# Patient Record
Sex: Male | Born: 1960 | Hispanic: Yes | Smoking: Never smoker
Health system: Southern US, Community
[De-identification: ages and names within clinical notes are randomized; demographics above are authoritative.]

## PROBLEM LIST (undated history)

## (undated) DIAGNOSIS — I10 Essential (primary) hypertension: Secondary | ICD-10-CM

## (undated) DIAGNOSIS — Z9889 Other specified postprocedural states: Secondary | ICD-10-CM

## (undated) DIAGNOSIS — R972 Elevated prostate specific antigen [PSA]: Secondary | ICD-10-CM

## (undated) DIAGNOSIS — G4730 Sleep apnea, unspecified: Secondary | ICD-10-CM

## (undated) DIAGNOSIS — T7840XA Allergy, unspecified, initial encounter: Secondary | ICD-10-CM

## (undated) DIAGNOSIS — E119 Type 2 diabetes mellitus without complications: Secondary | ICD-10-CM

## (undated) DIAGNOSIS — C4490 Unspecified malignant neoplasm of skin, unspecified: Secondary | ICD-10-CM

## (undated) DIAGNOSIS — R112 Nausea with vomiting, unspecified: Secondary | ICD-10-CM

## (undated) DIAGNOSIS — G4733 Obstructive sleep apnea (adult) (pediatric): Secondary | ICD-10-CM

## (undated) HISTORY — DX: Essential (primary) hypertension: I10

## (undated) HISTORY — DX: Nausea with vomiting, unspecified: Z98.890

## (undated) HISTORY — DX: Unspecified malignant neoplasm of skin, unspecified: C44.90

## (undated) HISTORY — DX: Allergy, unspecified, initial encounter: T78.40XA

## (undated) HISTORY — DX: Nausea with vomiting, unspecified: R11.2

## (undated) HISTORY — PX: KNEE ARTHROSCOPY: SUR90

## (undated) HISTORY — DX: Obstructive sleep apnea (adult) (pediatric): G47.33

## (undated) HISTORY — DX: Type 2 diabetes mellitus without complications: E11.9

## (undated) HISTORY — DX: Sleep apnea, unspecified: G47.30

## (undated) HISTORY — DX: Elevated prostate specific antigen (PSA): R97.20

---

## 2001-07-21 ENCOUNTER — Encounter: Payer: Self-pay | Admitting: Family Medicine

## 2003-02-02 ENCOUNTER — Encounter: Payer: Self-pay | Admitting: Pulmonary Disease

## 2003-02-16 ENCOUNTER — Encounter: Payer: Self-pay | Admitting: Pulmonary Disease

## 2007-11-09 ENCOUNTER — Ambulatory Visit: Payer: Self-pay | Admitting: Family Medicine

## 2007-11-09 DIAGNOSIS — Z9189 Other specified personal risk factors, not elsewhere classified: Secondary | ICD-10-CM | POA: Insufficient documentation

## 2007-11-09 DIAGNOSIS — J309 Allergic rhinitis, unspecified: Secondary | ICD-10-CM | POA: Insufficient documentation

## 2007-11-09 LAB — CONVERTED CEMR LAB
Blood in Urine, dipstick: NEGATIVE
Glucose, Urine, Semiquant: NEGATIVE
Ketones, urine, test strip: NEGATIVE
Specific Gravity, Urine: 1.02
WBC Urine, dipstick: NEGATIVE
pH: 6.5

## 2007-11-17 LAB — CONVERTED CEMR LAB
Basophils Absolute: 0 10*3/uL (ref 0.0–0.1)
Basophils Relative: 0.6 % (ref 0.0–3.0)
Calcium: 9.4 mg/dL (ref 8.4–10.5)
Chloride: 106 meq/L (ref 96–112)
Creatinine, Ser: 0.8 mg/dL (ref 0.4–1.5)
Direct LDL: 153.8 mg/dL
Eosinophils Absolute: 0.2 10*3/uL (ref 0.0–0.7)
GFR calc Af Amer: 134 mL/min
GFR calc non Af Amer: 111 mL/min
HDL: 36.3 mg/dL — ABNORMAL LOW (ref >39.0)
MCHC: 35.2 g/dL (ref 30.0–36.0)
MCV: 93.9 fL (ref 78.0–100.0)
Monocytes Absolute: 0.6 10*3/uL (ref 0.1–1.0)
Neutro Abs: 4.1 10*3/uL (ref 1.4–7.7)
Neutrophils Relative %: 55.8 % (ref 43.0–77.0)
RBC: 4.21 M/uL — ABNORMAL LOW (ref 4.22–5.81)
TSH: 1.49 microintl units/mL (ref 0.35–5.50)
Total Bilirubin: 0.9 mg/dL (ref 0.3–1.2)

## 2007-11-24 ENCOUNTER — Ambulatory Visit: Payer: Self-pay | Admitting: Pulmonary Disease

## 2007-11-24 DIAGNOSIS — G4733 Obstructive sleep apnea (adult) (pediatric): Secondary | ICD-10-CM

## 2010-03-17 ENCOUNTER — Encounter: Payer: Self-pay | Admitting: Family Medicine

## 2010-03-17 ENCOUNTER — Ambulatory Visit (INDEPENDENT_AMBULATORY_CARE_PROVIDER_SITE_OTHER): Payer: 59 | Admitting: Family Medicine

## 2010-03-17 ENCOUNTER — Ambulatory Visit (INDEPENDENT_AMBULATORY_CARE_PROVIDER_SITE_OTHER)
Admission: RE | Admit: 2010-03-17 | Discharge: 2010-03-17 | Disposition: A | Discharge status: Home / Self Care | Payer: 59 | Source: Ambulatory Visit | Attending: Family Medicine | Admitting: Family Medicine

## 2010-03-17 ENCOUNTER — Telehealth: Payer: Self-pay | Admitting: Family Medicine

## 2010-03-17 VITALS — BP 140/90 | HR 64 | Temp 98.2°F

## 2010-03-17 DIAGNOSIS — S93409A Sprain of unspecified ligament of unspecified ankle, initial encounter: Secondary | ICD-10-CM

## 2010-03-17 NOTE — Progress Notes (Signed)
  Subjective:    Patient ID: Jose Faulkner, male    DOB: 04-Mar-1960, 50 y.o.   MRN: [id number]  HPI Here for an injury to the left ankle which occurred at home yesterday evening when he stepped into a hole in his yard. He felt and heard a "pop" on the outside of the foot. He has done nothing for it since then.    Review of Systems  Constitutional: Negative.   Musculoskeletal: Positive for joint swelling and arthralgias.       Objective:   Physical Exam  Constitutional:       Walks with a limp, not in a lot of pain   Musculoskeletal:       The lateral malleolus of the left ankle is tender and swollen and warm. No crepitus, full ROM           Assessment & Plan:  This is at least a sprain, need to rule out an avulsion fracture. Stay off the foot today, use ice packs and Motrin prn

## 2010-03-17 NOTE — Telephone Encounter (Signed)
Pt would like xray results. 

## 2010-03-19 ENCOUNTER — Ambulatory Visit: Payer: Self-pay | Admitting: Family Medicine

## 2010-03-19 NOTE — Telephone Encounter (Signed)
See report. No fractures, so this was just a sprain. Wear the brace for a week or two. Follow up prn

## 2010-03-19 NOTE — Telephone Encounter (Signed)
Mess left and advised to call back if questions

## 2010-10-29 ENCOUNTER — Other Ambulatory Visit (INDEPENDENT_AMBULATORY_CARE_PROVIDER_SITE_OTHER): Payer: 59

## 2010-10-29 DIAGNOSIS — Z0000 Encounter for general adult medical examination without abnormal findings: Secondary | ICD-10-CM

## 2010-10-29 LAB — HEPATIC FUNCTION PANEL
ALT: 33 U/L (ref 0–53)
AST: 24 U/L (ref 0–37)
Bilirubin, Direct: 0 mg/dL (ref 0.0–0.3)
Total Protein: 7.7 g/dL (ref 6.0–8.3)

## 2010-10-29 LAB — CBC WITH DIFFERENTIAL/PLATELET
Basophils Relative: 0.7 % (ref 0.0–3.0)
Eosinophils Relative: 3.4 % (ref 0.0–5.0)
HCT: 41.5 % (ref 39.0–52.0)
Lymphs Abs: 1.8 10*3/uL (ref 0.7–4.0)
MCV: 95.3 fl (ref 78.0–100.0)
Monocytes Absolute: 0.5 10*3/uL (ref 0.1–1.0)
Monocytes Relative: 7.1 % (ref 3.0–12.0)
Neutrophils Relative %: 60.9 % (ref 43.0–77.0)
RBC: 4.36 Mil/uL (ref 4.22–5.81)
WBC: 6.5 10*3/uL (ref 4.5–10.5)

## 2010-10-29 LAB — POCT URINALYSIS DIPSTICK
Bilirubin, UA: NEGATIVE
Blood, UA: NEGATIVE
Ketones, UA: NEGATIVE
Leukocytes, UA: NEGATIVE
Spec Grav, UA: 1.015
pH, UA: 7.5

## 2010-10-29 LAB — BASIC METABOLIC PANEL
Chloride: 107 mEq/L (ref 96–112)
Potassium: 4.2 mEq/L (ref 3.5–5.1)
Sodium: 140 mEq/L (ref 135–145)

## 2010-10-29 LAB — LIPID PANEL
Cholesterol: 186 mg/dL (ref 0–200)
HDL: 41.5 mg/dL (ref >39.00)
Triglycerides: 112 mg/dL (ref 0.0–149.0)
VLDL: 22.4 mg/dL (ref 0.0–40.0)

## 2010-11-03 ENCOUNTER — Telehealth: Payer: Self-pay | Admitting: Family Medicine

## 2010-11-03 NOTE — Telephone Encounter (Signed)
Message copied by Baldemar Friday on Mon Nov 03, 2010  4:37 PM ------      Message from: Gershon Crane A      Created: Fri Oct 31, 2010  1:26 PM       normal

## 2010-11-03 NOTE — Telephone Encounter (Signed)
Left voice message with normal results. 

## 2010-11-05 ENCOUNTER — Encounter: Payer: Self-pay | Admitting: Family Medicine

## 2010-11-05 ENCOUNTER — Ambulatory Visit (INDEPENDENT_AMBULATORY_CARE_PROVIDER_SITE_OTHER): Payer: 59 | Admitting: Family Medicine

## 2010-11-05 VITALS — BP 130/80 | HR 74 | Temp 98.4°F | Ht 69.25 in | Wt 211.0 lb

## 2010-11-05 DIAGNOSIS — Z0000 Encounter for general adult medical examination without abnormal findings: Secondary | ICD-10-CM

## 2010-11-05 DIAGNOSIS — L989 Disorder of the skin and subcutaneous tissue, unspecified: Secondary | ICD-10-CM

## 2010-11-05 NOTE — Progress Notes (Signed)
  Subjective:    Patient ID: Jose Faulkner, male    DOB: 1960-10-05, 50 y.o.   MRN: [id number]  HPI 50 yr old male for a cpx. He feels well and has no concerns.    Review of Systems  Constitutional: Negative.   HENT: Negative.   Eyes: Negative.   Respiratory: Negative.   Cardiovascular: Negative.   Gastrointestinal: Negative.   Genitourinary: Negative.   Musculoskeletal: Negative.   Skin: Negative.   Neurological: Negative.   Hematological: Negative.   Psychiatric/Behavioral: Negative.        Objective:   Physical Exam  Constitutional: He is oriented to person, place, and time. He appears well-developed and well-nourished. No distress.  HENT:  Head: Normocephalic and atraumatic.  Right Ear: External ear normal.  Left Ear: External ear normal.  Nose: Nose normal.  Mouth/Throat: Oropharynx is clear and moist. No oropharyngeal exudate.  Eyes: Conjunctivae and EOM are normal. Pupils are equal, round, and reactive to light. Right eye exhibits no discharge. Left eye exhibits no discharge. No scleral icterus.  Neck: Neck supple. No JVD present. No tracheal deviation present. No thyromegaly present.  Cardiovascular: Normal rate, regular rhythm, normal heart sounds and intact distal pulses.  Exam reveals no gallop and no friction rub.   No murmur heard. Pulmonary/Chest: Effort normal and breath sounds normal. No respiratory distress. He has no wheezes. He has no rales. He exhibits no tenderness.  Abdominal: Soft. Bowel sounds are normal. He exhibits no distension and no mass. There is no tenderness. There is no rebound and no guarding.  Genitourinary: Rectum normal, prostate normal and penis normal. Guaiac negative stool. No penile tenderness.  Musculoskeletal: Normal range of motion. He exhibits no edema and no tenderness.  Lymphadenopathy:    He has no cervical adenopathy.  Neurological: He is alert and oriented to person, place, and time. He has normal reflexes. No cranial nerve  deficit. He exhibits normal muscle tone. Coordination normal.  Skin: Skin is warm and dry. No rash noted. He is not diaphoretic. No pallor.       There is a raised lesion on the scalp vertex with areas of erythema and some pearly white borders  Psychiatric: He has a normal mood and affect. His behavior is normal. Judgment and thought content normal.          Assessment & Plan:  Well exam. Refer to dermatology for the scalp lesion. This is worrisome for a possible basal cell cancer.

## 2010-12-06 HISTORY — PX: BASAL CELL CARCINOMA EXCISION: SHX1214

## 2011-02-24 ENCOUNTER — Ambulatory Visit (INDEPENDENT_AMBULATORY_CARE_PROVIDER_SITE_OTHER): Payer: 59 | Admitting: Pulmonary Disease

## 2011-02-24 ENCOUNTER — Encounter: Payer: Self-pay | Admitting: Pulmonary Disease

## 2011-02-24 VITALS — BP 136/80 | HR 78 | Temp 98.3°F | Ht 68.0 in | Wt 205.6 lb

## 2011-02-24 DIAGNOSIS — G4733 Obstructive sleep apnea (adult) (pediatric): Secondary | ICD-10-CM

## 2011-02-24 NOTE — Progress Notes (Signed)
  Subjective:    Patient ID: Jose Faulkner, male    DOB: December 08, 1960, 51 y.o.   MRN: [id number]  HPI Patient comes in today for followup of his known obstructive sleep apnea.  He has been wearing CPAP compliantly, but has not been seen in almost 4 years.  Patient states he has done very well with the CPAP, but most recently has had issues with his machine and also mask/headgear.  He is overdue for new equipment.  He feels that he had been sleeping well with excellent daytime alertness prior to this.  His weight has actually gone down since his last visit in 2009.  Sleep Questionnaire: What time do you typically go to bed?( Between what hours) 10:30 pm How long does it take you to fall asleep? 10 mins How many times during the night do you wake up? 2 What time do you get out of bed to start your day? 0530 Do you drive or operate heavy machinery in your occupation? No How much has your weight changed (up or down) over the past two years? (In pounds) 0 oz (0 kg) Have you ever had a sleep study before? Yes If yes, location of study? [facility], [place] If yes, date of study? 2005 Do you currently use CPAP? Yes If so, what pressure? ? Do you wear oxygen at any time? No     Review of Systems  Constitutional: Negative for fever and unexpected weight change.  HENT: Negative for ear pain, nosebleeds, congestion, sore throat, rhinorrhea, sneezing, trouble swallowing, dental problem, postnasal drip and sinus pressure.   Eyes: Negative for redness and itching.  Respiratory: Negative for cough, chest tightness, shortness of breath and wheezing.   Cardiovascular: Negative for palpitations and leg swelling.  Gastrointestinal: Negative for nausea and vomiting.  Genitourinary: Negative for dysuria.  Musculoskeletal: Negative for joint swelling.  Skin: Negative for rash.  Neurological: Negative for headaches.  Hematological: Does not bruise/bleed easily.  Psychiatric/Behavioral: Negative for dysphoric mood. The  patient is not nervous/anxious.        Objective:   Physical Exam Overweight male in no acute distress No skin breakdown or pressure necrosis from the CPAP mask Chest clear to auscultation Lower extremities without edema, no cyanosis Alert, does not appear to be sleepy, moves all 4 extremities.       Assessment & Plan:

## 2011-02-24 NOTE — Assessment & Plan Note (Signed)
The patient has a history of moderate obstructive sleep apnea, and has done well through the years on CPAP.  He is now in need of new equipment and headgear/mask, and will arrange this through his DME.  I have asked him to work aggressively on weight loss, and followup with me in one year if doing well.

## 2011-02-24 NOTE — Patient Instructions (Signed)
Will get you a new cpap machine with mask and headgear. Work on weight loss followup with me in one year.

## 2011-10-20 ENCOUNTER — Other Ambulatory Visit (INDEPENDENT_AMBULATORY_CARE_PROVIDER_SITE_OTHER): Payer: 59

## 2011-10-20 DIAGNOSIS — Z0000 Encounter for general adult medical examination without abnormal findings: Secondary | ICD-10-CM

## 2011-10-20 LAB — CBC WITH DIFFERENTIAL/PLATELET
Basophils Absolute: 0.1 K/uL (ref 0.0–0.1)
Basophils Relative: 0.9 % (ref 0.0–3.0)
Eosinophils Absolute: 0.2 K/uL (ref 0.0–0.7)
Eosinophils Relative: 3.4 % (ref 0.0–5.0)
HCT: 41.5 % (ref 39.0–52.0)
Hemoglobin: 13.7 g/dL (ref 13.0–17.0)
Lymphocytes Relative: 32.3 % (ref 12.0–46.0)
Lymphs Abs: 2 K/uL (ref 0.7–4.0)
MCHC: 32.9 g/dL (ref 30.0–36.0)
MCV: 95.4 fl (ref 78.0–100.0)
Monocytes Absolute: 0.5 K/uL (ref 0.1–1.0)
Monocytes Relative: 7.5 % (ref 3.0–12.0)
Neutro Abs: 3.4 K/uL (ref 1.4–7.7)
Neutrophils Relative %: 55.9 % (ref 43.0–77.0)
Platelets: 178 K/uL (ref 150.0–400.0)
RBC: 4.35 Mil/uL (ref 4.22–5.81)
RDW: 13.1 % (ref 11.5–14.6)
WBC: 6 K/uL (ref 4.5–10.5)

## 2011-10-20 LAB — HEPATIC FUNCTION PANEL
ALT: 22 U/L (ref 0–53)
Total Bilirubin: 0.7 mg/dL (ref 0.3–1.2)
Total Protein: 7.5 g/dL (ref 6.0–8.3)

## 2011-10-20 LAB — POCT URINALYSIS DIPSTICK
Leukocytes, UA: NEGATIVE
Nitrite, UA: NEGATIVE
Protein, UA: NEGATIVE
pH, UA: 7

## 2011-10-20 LAB — LIPID PANEL
LDL Cholesterol: 128 mg/dL — ABNORMAL HIGH (ref 0–99)
Total CHOL/HDL Ratio: 4
VLDL: 13 mg/dL (ref 0.0–40.0)

## 2011-10-20 LAB — BASIC METABOLIC PANEL WITH GFR
BUN: 16 mg/dL (ref 6–23)
CO2: 28 meq/L (ref 19–32)
Calcium: 9 mg/dL (ref 8.4–10.5)
Chloride: 109 meq/L (ref 96–112)
Creatinine, Ser: 0.8 mg/dL (ref 0.4–1.5)
GFR: 114.04 mL/min
Glucose, Bld: 102 mg/dL — ABNORMAL HIGH (ref 70–99)
Potassium: 5 meq/L (ref 3.5–5.1)
Sodium: 142 meq/L (ref 135–145)

## 2011-10-20 LAB — TSH: TSH: 1.18 u[IU]/mL (ref 0.35–5.50)

## 2011-10-23 NOTE — Progress Notes (Signed)
Quick Note:  I left voice message with results. ______ 

## 2011-11-18 ENCOUNTER — Ambulatory Visit (INDEPENDENT_AMBULATORY_CARE_PROVIDER_SITE_OTHER): Payer: 59 | Admitting: Family Medicine

## 2011-11-18 ENCOUNTER — Encounter: Payer: Self-pay | Admitting: Family Medicine

## 2011-11-18 VITALS — BP 138/90 | HR 60 | Temp 97.9°F | Ht 68.25 in | Wt 190.0 lb

## 2011-11-18 DIAGNOSIS — Z0000 Encounter for general adult medical examination without abnormal findings: Secondary | ICD-10-CM

## 2011-11-18 NOTE — Progress Notes (Signed)
  Subjective:    Patient ID: Jose Faulkner, male    DOB: [birth year], 51 y.o.   MRN: [id number]  HPI 51 yr old male for a cpx. He feels well and has no concerns. He has lost 15 lbs in the past year by watching his diet.    Review of Systems  Constitutional: Negative.   HENT: Negative.   Eyes: Negative.   Respiratory: Negative.   Cardiovascular: Negative.   Gastrointestinal: Negative.   Genitourinary: Negative.   Musculoskeletal: Negative.   Skin: Negative.   Neurological: Negative.   Hematological: Negative.   Psychiatric/Behavioral: Negative.        Objective:   Physical Exam  Constitutional: He is oriented to person, place, and time. He appears well-developed and well-nourished. No distress.  HENT:  Head: Normocephalic and atraumatic.  Right Ear: External ear normal.  Left Ear: External ear normal.  Nose: Nose normal.  Mouth/Throat: Oropharynx is clear and moist. No oropharyngeal exudate.  Eyes: Conjunctivae normal and EOM are normal. Pupils are equal, round, and reactive to light. Right eye exhibits no discharge. Left eye exhibits no discharge. No scleral icterus.  Neck: Neck supple. No JVD present. No tracheal deviation present. No thyromegaly present.  Cardiovascular: Normal rate, regular rhythm, normal heart sounds and intact distal pulses.  Exam reveals no gallop and no friction rub.   No murmur heard. Pulmonary/Chest: Effort normal and breath sounds normal. No respiratory distress. He has no wheezes. He has no rales. He exhibits no tenderness.  Abdominal: Soft. Bowel sounds are normal. He exhibits no distension and no mass. There is no tenderness. There is no rebound and no guarding.  Genitourinary: Rectum normal, prostate normal and penis normal. Guaiac negative stool. No penile tenderness.  Musculoskeletal: Normal range of motion. He exhibits no edema and no tenderness.  Lymphadenopathy:    He has no cervical adenopathy.  Neurological: He is alert and oriented to  person, place, and time. He has normal reflexes. No cranial nerve deficit. He exhibits normal muscle tone. Coordination normal.  Skin: Skin is warm and dry. No rash noted. He is not diaphoretic. No erythema. No pallor.  Psychiatric: He has a normal mood and affect. His behavior is normal. Judgment and thought content normal.          Assessment & Plan:  Well exam.

## 2012-02-24 ENCOUNTER — Ambulatory Visit: Payer: 59 | Admitting: Pulmonary Disease

## 2012-11-14 ENCOUNTER — Ambulatory Visit (INDEPENDENT_AMBULATORY_CARE_PROVIDER_SITE_OTHER): Payer: 59 | Admitting: Family Medicine

## 2012-11-14 ENCOUNTER — Encounter: Payer: Self-pay | Admitting: Family Medicine

## 2012-11-14 VITALS — BP 138/82 | HR 55 | Temp 97.6°F | Wt 194.0 lb

## 2012-11-14 DIAGNOSIS — IMO0002 Reserved for concepts with insufficient information to code with codable children: Secondary | ICD-10-CM

## 2012-11-14 DIAGNOSIS — S2341XA Sprain of ribs, initial encounter: Secondary | ICD-10-CM

## 2012-11-14 DIAGNOSIS — L089 Local infection of the skin and subcutaneous tissue, unspecified: Secondary | ICD-10-CM

## 2012-11-14 DIAGNOSIS — S86912A Strain of unspecified muscle(s) and tendon(s) at lower leg level, left leg, initial encounter: Secondary | ICD-10-CM
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
## 2012-11-14 NOTE — Progress Notes (Signed)
Pre visit review using our clinic review tool, if applicable. No additional management support is needed unless otherwise documented below in the visit note. 

## 2012-12-07 ENCOUNTER — Other Ambulatory Visit (INDEPENDENT_AMBULATORY_CARE_PROVIDER_SITE_OTHER): Payer: 59

## 2012-12-07 DIAGNOSIS — Z0000 Encounter for general adult medical examination without abnormal findings: Secondary | ICD-10-CM

## 2012-12-07 LAB — CBC WITH DIFFERENTIAL/PLATELET
Basophils Relative: 0.9 % (ref 0.0–3.0)
Eosinophils Relative: 3.5 % (ref 0.0–5.0)
Hemoglobin: 14.4 g/dL (ref 13.0–17.0)
Lymphocytes Relative: 27.5 % (ref 12.0–46.0)
MCHC: 33.8 g/dL (ref 30.0–36.0)
Monocytes Relative: 7.8 % (ref 3.0–12.0)
Neutro Abs: 3.7 10*3/uL (ref 1.4–7.7)
Neutrophils Relative %: 60.3 % (ref 43.0–77.0)
RBC: 4.6 Mil/uL (ref 4.22–5.81)
WBC: 6.1 10*3/uL (ref 4.5–10.5)

## 2012-12-07 LAB — HEPATIC FUNCTION PANEL
ALT: 23 U/L (ref 0–53)
AST: 18 U/L (ref 0–37)
Albumin: 4.1 g/dL (ref 3.5–5.2)
Alkaline Phosphatase: 71 U/L (ref 39–117)
Bilirubin, Direct: 0.1 mg/dL (ref 0.0–0.3)
Total Protein: 7.7 g/dL (ref 6.0–8.3)

## 2012-12-07 LAB — LIPID PANEL
HDL: 45.8 mg/dL (ref >39.00)
Total CHOL/HDL Ratio: 5
VLDL: 16.8 mg/dL (ref 0.0–40.0)

## 2012-12-07 LAB — POCT URINALYSIS DIPSTICK
Bilirubin, UA: NEGATIVE
Ketones, UA: NEGATIVE
Leukocytes, UA: NEGATIVE
Spec Grav, UA: >=1.03
pH, UA: 6

## 2012-12-07 LAB — BASIC METABOLIC PANEL
CO2: 28 mEq/L (ref 19–32)
Calcium: 9.1 mg/dL (ref 8.4–10.5)
GFR: 105.56 mL/min (ref >60.00)
Sodium: 140 mEq/L (ref 135–145)

## 2012-12-14 ENCOUNTER — Ambulatory Visit (INDEPENDENT_AMBULATORY_CARE_PROVIDER_SITE_OTHER): Payer: 59 | Admitting: Family Medicine

## 2012-12-14 ENCOUNTER — Encounter: Payer: Self-pay | Admitting: Family Medicine

## 2012-12-14 VITALS — BP 140/80 | HR 57 | Temp 97.9°F | Ht 68.0 in | Wt 191.0 lb

## 2012-12-14 DIAGNOSIS — R972 Elevated prostate specific antigen [PSA]: Secondary | ICD-10-CM

## 2012-12-14 DIAGNOSIS — Z0000 Encounter for general adult medical examination without abnormal findings: Secondary | ICD-10-CM

## 2012-12-14 NOTE — Progress Notes (Signed)
Pre visit review using our clinic review tool, if applicable. No additional management support is needed unless otherwise documented below in the visit note. 

## 2012-12-14 NOTE — Progress Notes (Signed)
   Subjective:    Patient ID: Jose Faulkner, male    DOB: 1960/02/17, 52 y.o.   MRN: [id number]  HPI 52 yr old male for a cpx. He feels well with no complaints. When I ask him about any urinary sx he admits to some frequency, urgency, slow stream, and nocturia. No discomfort. Of note his baseline PSA last week was 5.13.    Review of Systems  Constitutional: Negative.   HENT: Negative.   Eyes: Negative.   Respiratory: Negative.   Cardiovascular: Negative.   Gastrointestinal: Negative.   Genitourinary: Positive for urgency and frequency. Negative for dysuria, hematuria, flank pain, discharge, penile swelling, scrotal swelling, enuresis, difficulty urinating, genital sores, penile pain and testicular pain.  Musculoskeletal: Negative.   Skin: Negative.   Neurological: Negative.   Psychiatric/Behavioral: Negative.        Objective:   Physical Exam  Constitutional: He is oriented to person, place, and time. He appears well-developed and well-nourished. No distress.  HENT:  Head: Normocephalic and atraumatic.  Right Ear: External ear normal.  Left Ear: External ear normal.  Nose: Nose normal.  Mouth/Throat: Oropharynx is clear and moist. No oropharyngeal exudate.  Eyes: Conjunctivae and EOM are normal. Pupils are equal, round, and reactive to light. Right eye exhibits no discharge. Left eye exhibits no discharge. No scleral icterus.  Neck: Neck supple. No JVD present. No tracheal deviation present. No thyromegaly present.  Cardiovascular: Normal rate, regular rhythm, normal heart sounds and intact distal pulses.  Exam reveals no gallop and no friction rub.   No murmur heard. EKG normal   Pulmonary/Chest: Effort normal and breath sounds normal. No respiratory distress. He has no wheezes. He has no rales. He exhibits no tenderness.  Abdominal: Soft. Bowel sounds are normal. He exhibits no distension and no mass. There is no tenderness. There is no rebound and no guarding.  Genitourinary:  Rectum normal and penis normal. Guaiac negative stool. No penile tenderness.  Prostate is fairly enlarged but smooth and non-tender   Musculoskeletal: Normal range of motion. He exhibits no edema and no tenderness.  Lymphadenopathy:    He has no cervical adenopathy.  Neurological: He is alert and oriented to person, place, and time. He has normal reflexes. No cranial nerve deficit. He exhibits normal muscle tone. Coordination normal.  Skin: Skin is warm and dry. No rash noted. He is not diaphoretic. No erythema. No pallor.  Psychiatric: He has a normal mood and affect. His behavior is normal. Judgment and thought content normal.          Assessment & Plan:  Well exam. He has typical BPH sx and his PSA is elevated. We will refer him to Urology to evaluate. Set up his first colonoscopy. He needs to lose weight to get his cholesterol and glucose down.

## 2012-12-15 ENCOUNTER — Encounter: Payer: Self-pay | Admitting: Internal Medicine

## 2012-12-26 ENCOUNTER — Telehealth: Payer: Self-pay | Admitting: Family Medicine

## 2012-12-26 NOTE — Telephone Encounter (Signed)
This is noted the pt's health maintenance.

## 2012-12-26 NOTE — Telephone Encounter (Signed)
Pt decline flu shot

## 2013-01-16 ENCOUNTER — Ambulatory Visit (AMBULATORY_SURGERY_CENTER): Payer: Self-pay

## 2013-01-16 VITALS — Ht 68.0 in | Wt 185.0 lb

## 2013-01-16 DIAGNOSIS — Z1211 Encounter for screening for malignant neoplasm of colon: Secondary | ICD-10-CM

## 2013-01-16 MED ORDER — MOVIPREP 100 G PO SOLR: 1.0000 | Freq: Once | ORAL | Status: DC

## 2013-01-18 ENCOUNTER — Encounter: Payer: Self-pay | Admitting: Internal Medicine

## 2013-01-20 ENCOUNTER — Telehealth: Payer: Self-pay | Admitting: Internal Medicine

## 2013-01-20 NOTE — Telephone Encounter (Signed)
i attempted to reach this pt four times t/o day.  No answer and no ID on machine

## 2013-01-25 ENCOUNTER — Encounter: Payer: 59 | Admitting: Internal Medicine

## 2013-08-21 ENCOUNTER — Encounter: Payer: Self-pay | Admitting: Physician Assistant

## 2013-08-21 ENCOUNTER — Ambulatory Visit (INDEPENDENT_AMBULATORY_CARE_PROVIDER_SITE_OTHER): Payer: 59 | Admitting: Physician Assistant

## 2013-08-21 VITALS — BP 138/80 | HR 62 | Temp 98.6°F | Resp 18 | Wt 198.0 lb

## 2013-08-21 DIAGNOSIS — H8110 Benign paroxysmal vertigo, unspecified ear: Secondary | ICD-10-CM

## 2013-08-21 NOTE — Progress Notes (Signed)
Pre visit review using our clinic review tool, if applicable. No additional management support is needed unless otherwise documented below in the visit note. 

## 2013-08-21 NOTE — Patient Instructions (Addendum)
Force NON dairy fluids, drinking plenty of water is best.    Over the Counter Flonase OR Nasacort AQ 1 spray in each nostril twice a day as needed. Use the "crossover" technique into opposite nostril spraying toward opposite ear @ 45 degree angle, not straight up into nostril.   Plain Over the Counter Allegra (NOT D )  160 daily , OR Loratidine 10 mg , OR Zyrtec 10 mg @ bedtime  as needed.  Try using the Home Maneuver provided to help your symptoms.  If emergency symptoms discussed during visit developed, seek medical attention immediately.  Followup as needed, or for worsening or persistent symptoms despite treatment.    Benign Positional Vertigo Vertigo means you feel like you or your surroundings are moving when they are not. Benign positional vertigo is the most common form of vertigo. Benign means that the cause of your condition is not serious. Benign positional vertigo is more common in older adults. CAUSES  Benign positional vertigo is the result of an upset in the labyrinth system. This is an area in the middle ear that helps control your balance. This may be caused by a viral infection, head injury, or repetitive motion. However, often no specific cause is found. SYMPTOMS  Symptoms of benign positional vertigo occur when you move your head or eyes in different directions. Some of the symptoms may include:  Loss of balance and falls.  Vomiting.  Blurred vision.  Dizziness.  Nausea.  Involuntary eye movements (nystagmus). DIAGNOSIS  Benign positional vertigo is usually diagnosed by physical exam. If the specific cause of your benign positional vertigo is unknown, your caregiver may perform imaging tests, such as magnetic resonance imaging (MRI) or computed tomography (CT). TREATMENT  Your caregiver may recommend movements or procedures to correct the benign positional vertigo. Medicines such as meclizine, benzodiazepines, and medicines for nausea may be used to treat your  symptoms. In rare cases, if your symptoms are caused by certain conditions that affect the inner ear, you may need surgery. HOME CARE INSTRUCTIONS   Follow your caregiver's instructions.  Move slowly. Do not make sudden body or head movements.  Avoid driving.  Avoid operating heavy machinery.  Avoid performing any tasks that would be dangerous to you or others during a vertigo episode.  Drink enough fluids to keep your urine clear or pale yellow. SEEK IMMEDIATE MEDICAL CARE IF:   You develop problems with walking, weakness, numbness, or using your arms, hands, or legs.  You have difficulty speaking.  You develop severe headaches.  Your nausea or vomiting continues or gets worse.  You develop visual changes.  Your family or friends notice any behavioral changes.  Your condition gets worse.  You have a fever.  You develop a stiff neck or sensitivity to light. MAKE SURE YOU:   Understand these instructions.  Will watch your condition.  Will get help right away if you are not doing well or get worse. Document Released: 09/29/2005 Document Revised: 03/16/2011 Document Reviewed: 09/11/2010 [facility] Patient Information 2015 [place], [place]. This information is not intended to replace advice given to you by your health care provider. Make sure you discuss any questions you have with your health care provider.

## 2013-08-21 NOTE — Progress Notes (Signed)
Subjective:    Patient ID: Jose Faulkner, male    DOB: 1960/08/22, 53 y.o.   MRN: [id number]  Dizziness This is a new problem. The current episode started yesterday. The problem occurs constantly. The problem has been waxing and waning. Associated symptoms include headaches (mild), nausea and vertigo. Pertinent negatives include no abdominal pain, anorexia, arthralgias, change in bowel habit, chest pain, chills, congestion, coughing, diaphoresis, fatigue, fever, joint swelling, myalgias, neck pain, numbness, rash, sore throat, swollen glands, urinary symptoms, visual change, vomiting or weakness. Exacerbated by: moving head to the left or right quickly. He has tried rest, sleep and walking for the symptoms. The treatment provided mild relief.      Review of Systems  Constitutional: Negative for fever, chills, diaphoresis and fatigue.  HENT: Negative for congestion and sore throat.   Respiratory: Negative for cough and shortness of breath.   Cardiovascular: Negative for chest pain.  Gastrointestinal: Positive for nausea. Negative for vomiting, abdominal pain, diarrhea, anorexia and change in bowel habit.  Musculoskeletal: Negative for arthralgias, joint swelling, myalgias and neck pain.  Skin: Negative for rash.  Neurological: Positive for dizziness, vertigo and headaches (mild). Negative for syncope, weakness, light-headedness and numbness.  All other systems reviewed and are negative.    Past Medical History  Diagnosis Date  . Allergy   . Sleep apnea, obstructive     sees Dr. Gwenette Greet, uses CPAP  . Skin cancer     History   Social History  . Marital Status: Married    Spouse Name: Maribel    Number of Children: N/A  . Years of Education: N/A   Occupational History  . Civil engineer, contracting Unemployed   Social History Main Topics  . Smoking status: Never Smoker   . Smokeless tobacco: Never Used  . Alcohol Use: No  . Drug Use: No  . Sexual Activity: Not on file   Other Topics  Concern  . Not on file   Social History Narrative  . No narrative on file    Past Surgical History  Procedure Laterality Date  . Knee arthroscopy      left knee  . Basal cell carcinoma excision  Dec. 2012    per Dr. Sherrye Payor, scalp vertex     Family History  Problem Relation Age of Onset  . Colon cancer Neg Hx     No Known Allergies  Current Outpatient Prescriptions on File Prior to Visit  Medication Sig Dispense Refill  . cetirizine (ZYRTEC) 10 MG tablet Take 10 mg by mouth daily.        Marland Kitchen MOVIPREP 100 G SOLR Take 1 kit (200 g total) by mouth once.  1 kit  0  . Multiple Vitamin (MULTIVITAMIN) tablet Take 1 tablet by mouth daily.       No current facility-administered medications on file prior to visit.    EXAM: BP 138/80  Pulse 62  Temp(Src) 98.6 F (37 C) (Oral)  Resp 18  Wt 198 lb (89.812 kg)  SpO2 98%     Objective:   Physical Exam  Nursing note and vitals reviewed. Constitutional: He is oriented to person, place, and time. He appears well-developed and well-nourished. No distress.  HENT:  Head: Normocephalic and atraumatic.  Right Ear: External ear normal.  Left Ear: External ear normal.  Bilateral TMs slightly bulging, otherwise normal.   Eyes: Conjunctivae and EOM are normal. Pupils are equal, round, and reactive to light.  Neck: Normal range of motion. Neck supple.  Cardiovascular: Normal  rate, regular rhythm and intact distal pulses.   Pulmonary/Chest: Effort normal and breath sounds normal. No stridor. No respiratory distress. He has no wheezes. He has no rales. He exhibits no tenderness.  Lymphadenopathy:    He has no cervical adenopathy.  Neurological: He is alert and oriented to person, place, and time.  Negative Dix-Hallpike  Skin: Skin is warm and dry. He is not diaphoretic.  Psychiatric: He has a normal mood and affect. His behavior is normal. Judgment and thought content normal.     Lab Results  Component Value Date   WBC 6.1 12/07/2012    HGB 14.4 12/07/2012   HCT 42.7 12/07/2012   PLT 192.0 12/07/2012   GLUCOSE 103* 12/07/2012   CHOL 213* 12/07/2012   TRIG 84.0 12/07/2012   HDL 45.80 12/07/2012   LDLDIRECT 160.1 12/07/2012   LDLCALC 128* 10/20/2011   ALT 23 12/07/2012   AST 18 12/07/2012   NA 140 12/07/2012   K 4.2 12/07/2012   CL 106 12/07/2012   CREATININE 0.8 12/07/2012   BUN 15 12/07/2012   CO2 28 12/07/2012   TSH 0.97 12/07/2012   PSA 5.13* 12/07/2012        Assessment & Plan:  Axle was seen today for dizziness.  Diagnoses and associated orders for this visit:  BPPV (benign paroxysmal positional vertigo), unspecified laterality Comments: possibly ETD related. Trial antihistamine, nasal steroid spray. Eply home maneuver.    Return precautions provided, and patient handout on Vertigo and [place].  Plan to follow up as needed, or for worsening or persistent symptoms despite treatment.  Patient Instructions  Force NON dairy fluids, drinking plenty of water is best.    Over the Counter Flonase OR Nasacort AQ 1 spray in each nostril twice a day as needed. Use the "crossover" technique into opposite nostril spraying toward opposite ear @ 45 degree angle, not straight up into nostril.   Plain Over the Counter Allegra (NOT D )  160 daily , OR Loratidine 10 mg , OR Zyrtec 10 mg @ bedtime  as needed.  Try using the Home Maneuver provided to help your symptoms.  If emergency symptoms discussed during visit developed, seek medical attention immediately.  Followup as needed, or for worsening or persistent symptoms despite treatment.

## 2013-12-20 ENCOUNTER — Telehealth: Payer: Self-pay | Admitting: Family Medicine

## 2013-12-20 NOTE — Telephone Encounter (Signed)
Pt came in he need a rx fax over to [facility] for the following supplies.   rx cpap nasal and mask supplies    Fax to ;Lucrezia Starch at [phone]

## 2013-12-20 NOTE — Telephone Encounter (Signed)
Dr. Sarajane Jews does not write orders for this. Pt will have to contact his pulmonary doctor.

## 2013-12-21 NOTE — Telephone Encounter (Signed)
lmovm to call back.

## 2013-12-22 NOTE — Telephone Encounter (Signed)
S/w pt and told him what dr fry said

## 2014-01-15 ENCOUNTER — Other Ambulatory Visit (INDEPENDENT_AMBULATORY_CARE_PROVIDER_SITE_OTHER): Payer: 59

## 2014-01-15 DIAGNOSIS — Z0000 Encounter for general adult medical examination without abnormal findings: Secondary | ICD-10-CM

## 2014-01-15 LAB — BASIC METABOLIC PANEL
BUN: 21 mg/dL (ref 6–23)
CALCIUM: 9.2 mg/dL (ref 8.4–10.5)
CO2: 28 mEq/L (ref 19–32)
Chloride: 107 mEq/L (ref 96–112)
Creatinine, Ser: 0.9 mg/dL (ref 0.4–1.5)
GFR: 96.87 mL/min (ref >60.00)
GLUCOSE: 113 mg/dL — AB (ref 70–99)
Potassium: 4.5 mEq/L (ref 3.5–5.1)
SODIUM: 141 meq/L (ref 135–145)

## 2014-01-15 LAB — POCT URINALYSIS DIPSTICK
BILIRUBIN UA: NEGATIVE
Blood, UA: NEGATIVE
Glucose, UA: NEGATIVE
Leukocytes, UA: NEGATIVE
NITRITE UA: NEGATIVE
Spec Grav, UA: >=1.03
Urobilinogen, UA: 0.2
pH, UA: 5.5

## 2014-01-15 LAB — CBC WITH DIFFERENTIAL/PLATELET
Basophils Absolute: 0.1 10*3/uL (ref 0.0–0.1)
Basophils Relative: 0.8 % (ref 0.0–3.0)
Eosinophils Absolute: 0.3 10*3/uL (ref 0.0–0.7)
Eosinophils Relative: 3.8 % (ref 0.0–5.0)
HCT: 44.4 % (ref 39.0–52.0)
Hemoglobin: 14.6 g/dL (ref 13.0–17.0)
Lymphocytes Relative: 33.1 % (ref 12.0–46.0)
Lymphs Abs: 2.4 10*3/uL (ref 0.7–4.0)
MCHC: 33 g/dL (ref 30.0–36.0)
MCV: 95 fl (ref 78.0–100.0)
MONO ABS: 0.6 10*3/uL (ref 0.1–1.0)
MONOS PCT: 8.4 % (ref 3.0–12.0)
NEUTROS PCT: 53.9 % (ref 43.0–77.0)
Neutro Abs: 3.9 10*3/uL (ref 1.4–7.7)
PLATELETS: 195 10*3/uL (ref 150.0–400.0)
RBC: 4.67 Mil/uL (ref 4.22–5.81)
RDW: 13.1 % (ref 11.5–15.5)
WBC: 7.3 10*3/uL (ref 4.0–10.5)

## 2014-01-15 LAB — HEPATIC FUNCTION PANEL
ALK PHOS: 79 U/L (ref 39–117)
ALT: 32 U/L (ref 0–53)
AST: 21 U/L (ref 0–37)
Albumin: 4.2 g/dL (ref 3.5–5.2)
BILIRUBIN DIRECT: 0.1 mg/dL (ref 0.0–0.3)
BILIRUBIN TOTAL: 0.7 mg/dL (ref 0.2–1.2)
Total Protein: 7.8 g/dL (ref 6.0–8.3)

## 2014-01-15 LAB — PSA: PSA: 8.75 ng/mL — ABNORMAL HIGH (ref 0.10–4.00)

## 2014-01-15 LAB — LIPID PANEL
Cholesterol: 219 mg/dL — ABNORMAL HIGH (ref 0–200)
HDL: 40.5 mg/dL (ref >39.00)
LDL CALC: 152 mg/dL — AB (ref 0–99)
NONHDL: 178.5
Total CHOL/HDL Ratio: 5
Triglycerides: 133 mg/dL (ref 0.0–149.0)
VLDL: 26.6 mg/dL (ref 0.0–40.0)

## 2014-01-15 LAB — TSH: TSH: 1.91 u[IU]/mL (ref 0.35–4.50)

## 2014-01-22 ENCOUNTER — Ambulatory Visit (INDEPENDENT_AMBULATORY_CARE_PROVIDER_SITE_OTHER): Payer: 59 | Admitting: Family Medicine

## 2014-01-22 ENCOUNTER — Encounter: Payer: Self-pay | Admitting: Family Medicine

## 2014-01-22 VITALS — BP 157/78 | HR 50 | Temp 97.6°F | Ht 68.0 in | Wt 200.0 lb

## 2014-01-22 DIAGNOSIS — Z0000 Encounter for general adult medical examination without abnormal findings: Secondary | ICD-10-CM

## 2014-01-22 NOTE — Progress Notes (Signed)
   Subjective:    Patient ID: Jose Faulkner, male    DOB: 02-Feb-1960, 54 y.o.   MRN: [id number]  HPI 54 yr old male for a cpx. He feels well.    Review of Systems  Constitutional: Negative.   HENT: Negative.   Eyes: Negative.   Respiratory: Negative.   Cardiovascular: Negative.   Gastrointestinal: Negative.   Genitourinary: Negative.   Musculoskeletal: Negative.   Skin: Negative.   Neurological: Negative.   Psychiatric/Behavioral: Negative.        Objective:   Physical Exam  Constitutional: He is oriented to person, place, and time. He appears well-developed and well-nourished. No distress.  HENT:  Head: Normocephalic and atraumatic.  Right Ear: External ear normal.  Left Ear: External ear normal.  Nose: Nose normal.  Mouth/Throat: Oropharynx is clear and moist. No oropharyngeal exudate.  Eyes: Conjunctivae and EOM are normal. Pupils are equal, round, and reactive to light. Right eye exhibits no discharge. Left eye exhibits no discharge. No scleral icterus.  Neck: Neck supple. No JVD present. No tracheal deviation present. No thyromegaly present.  Cardiovascular: Normal rate, regular rhythm, normal heart sounds and intact distal pulses.  Exam reveals no gallop and no friction rub.   No murmur heard. EKG normal   Pulmonary/Chest: Effort normal and breath sounds normal. No respiratory distress. He has no wheezes. He has no rales. He exhibits no tenderness.  Abdominal: Soft. Bowel sounds are normal. He exhibits no distension and no mass. There is no tenderness. There is no rebound and no guarding.  Musculoskeletal: Normal range of motion. He exhibits no edema or tenderness.  Lymphadenopathy:    He has no cervical adenopathy.  Neurological: He is alert and oriented to person, place, and time. He has normal reflexes. No cranial nerve deficit. He exhibits normal muscle tone. Coordination normal.  Skin: Skin is warm and dry. No rash noted. He is not diaphoretic. No erythema. No  pallor.  Psychiatric: He has a normal mood and affect. His behavior is normal. Judgment and thought content normal.          Assessment & Plan:  Well exam. We spent time discussing the importance of him exercising regularly and watching a healthy diet. His weight, glucose, chol, and BP have all gone up. Recheck a BP here in 6 months. He is set to see Urology next month to follow up on his prostate. He missed the colonoscopy last year so he will contact the GI office again to have this set up.

## 2014-01-22 NOTE — Progress Notes (Signed)
Pre visit review using our clinic review tool, if applicable. No additional management support is needed unless otherwise documented below in the visit note. 

## 2014-03-21 ENCOUNTER — Other Ambulatory Visit (HOSPITAL_COMMUNITY): Payer: Self-pay | Admitting: Urology

## 2014-03-21 DIAGNOSIS — R972 Elevated prostate specific antigen [PSA]: Secondary | ICD-10-CM

## 2014-04-09 ENCOUNTER — Ambulatory Visit (HOSPITAL_COMMUNITY)
Admission: RE | Admit: 2014-04-09 | Discharge: 2014-04-09 | Disposition: A | Discharge status: Home / Self Care | Payer: 59 | Source: Ambulatory Visit | Attending: Urology | Admitting: Urology

## 2014-04-09 ENCOUNTER — Other Ambulatory Visit (HOSPITAL_COMMUNITY): Payer: Self-pay | Admitting: Urology

## 2014-04-09 DIAGNOSIS — Z9889 Other specified postprocedural states: Secondary | ICD-10-CM

## 2014-04-09 DIAGNOSIS — R972 Elevated prostate specific antigen [PSA]: Secondary | ICD-10-CM

## 2014-04-09 LAB — POCT I-STAT CREATININE: CREATININE: 0.9 mg/dL (ref 0.50–1.35)

## 2014-04-09 MED ORDER — GADOBENATE DIMEGLUMINE 529 MG/ML IV SOLN
20.0000 mL | Freq: Once | INTRAVENOUS | Status: AC | PRN
  Administered 2014-04-09: 19 mL via INTRAVENOUS

## 2014-07-25 ENCOUNTER — Ambulatory Visit (INDEPENDENT_AMBULATORY_CARE_PROVIDER_SITE_OTHER): Payer: Commercial Managed Care - HMO | Admitting: Family Medicine

## 2014-07-25 ENCOUNTER — Encounter: Payer: Self-pay | Admitting: Family Medicine

## 2014-07-25 VITALS — BP 131/68 | HR 62 | Temp 98.4°F | Ht 68.0 in | Wt 193.0 lb

## 2014-07-25 DIAGNOSIS — R030 Elevated blood-pressure reading, without diagnosis of hypertension: Secondary | ICD-10-CM

## 2014-07-25 DIAGNOSIS — E785 Hyperlipidemia, unspecified: Secondary | ICD-10-CM | POA: Diagnosis not present

## 2014-07-25 DIAGNOSIS — R739 Hyperglycemia, unspecified: Secondary | ICD-10-CM | POA: Diagnosis not present

## 2014-07-25 DIAGNOSIS — IMO0001 Reserved for inherently not codable concepts without codable children: Secondary | ICD-10-CM

## 2014-07-25 NOTE — Progress Notes (Signed)
   Subjective:    Patient ID: Jose Faulkner, male    DOB: [birth year], 54 y.o.   MRN: [id number]  HPI Here to follow up overweight, elevated BP, high glucoses and high lipids. He has changed his diet and has lost 7 lbs. He feels well.    Review of Systems  Constitutional: Negative.   Respiratory: Negative.   Cardiovascular: Negative.   Endocrine: Negative.        Objective:   Physical Exam  Constitutional: He appears well-developed and well-nourished.  Neck: No thyromegaly present.  Cardiovascular: Normal rate, normal heart sounds and intact distal pulses.   Pulmonary/Chest: Effort normal and breath sounds normal.  Lymphadenopathy:    He has no cervical adenopathy.          Assessment & Plan:  He has made some lifestyle changes and has lost weight. His BP is now normal. Get fasting labs soon

## 2014-07-25 NOTE — Progress Notes (Signed)
Pre visit review using our clinic review tool, if applicable. No additional management support is needed unless otherwise documented below in the visit note. 

## 2014-08-01 ENCOUNTER — Other Ambulatory Visit (INDEPENDENT_AMBULATORY_CARE_PROVIDER_SITE_OTHER): Payer: Commercial Managed Care - HMO

## 2014-08-01 DIAGNOSIS — E785 Hyperlipidemia, unspecified: Secondary | ICD-10-CM | POA: Diagnosis not present

## 2014-08-01 DIAGNOSIS — R739 Hyperglycemia, unspecified: Secondary | ICD-10-CM

## 2014-08-01 LAB — LIPID PANEL
CHOLESTEROL: 191 mg/dL (ref 0–200)
HDL: 41 mg/dL (ref >39.00)
LDL Cholesterol: 137 mg/dL — ABNORMAL HIGH (ref 0–99)
NONHDL: 150
Total CHOL/HDL Ratio: 5
Triglycerides: 65 mg/dL (ref 0.0–149.0)
VLDL: 13 mg/dL (ref 0.0–40.0)

## 2014-08-01 LAB — HEMOGLOBIN A1C: Hgb A1c MFr Bld: 5.4 % (ref 4.6–6.5)

## 2015-01-06 HISTORY — PX: PROSTATE BIOPSY: SHX241

## 2015-07-20 ENCOUNTER — Emergency Department (HOSPITAL_COMMUNITY)
Admission: EM | Admit: 2015-07-20 | Discharge: 2015-07-21 | Disposition: A | Discharge status: Home / Self Care | Payer: Commercial Managed Care - HMO | Attending: Emergency Medicine | Admitting: Emergency Medicine

## 2015-07-20 DIAGNOSIS — Z85828 Personal history of other malignant neoplasm of skin: Secondary | ICD-10-CM | POA: Insufficient documentation

## 2015-07-20 DIAGNOSIS — R109 Unspecified abdominal pain: Secondary | ICD-10-CM | POA: Diagnosis present

## 2015-07-20 DIAGNOSIS — Z79899 Other long term (current) drug therapy: Secondary | ICD-10-CM | POA: Insufficient documentation

## 2015-07-20 DIAGNOSIS — R338 Other retention of urine: Secondary | ICD-10-CM

## 2015-07-20 DIAGNOSIS — R339 Retention of urine, unspecified: Secondary | ICD-10-CM | POA: Insufficient documentation

## 2015-07-20 NOTE — ED Notes (Signed)
Per  [facility] patient c/o right flank pain and difficulty urinating x8 hours.  Pain came on all of a sudden.  Per EMS 18 g in Lft AC, 30 mg toradol en route.  VS en route BP 180/80.

## 2015-07-20 NOTE — ED Notes (Signed)
Bed: [id number] Expected date:  Expected time:  Means of arrival:  Comments: EMS flank pain

## 2015-07-21 ENCOUNTER — Encounter (HOSPITAL_COMMUNITY): Payer: Self-pay | Admitting: Emergency Medicine

## 2015-07-21 MED ORDER — TAMSULOSIN HCL 0.4 MG PO CAPS
0.4000 mg | ORAL_CAPSULE | Freq: Once | ORAL | Status: AC
  Administered 2015-07-21: 0.4 mg via ORAL
  Filled 2015-07-21: qty 1

## 2015-07-21 MED ORDER — TAMSULOSIN HCL 0.4 MG PO CAPS: 0.4000 mg | ORAL_CAPSULE | Freq: Every day | ORAL | Status: DC

## 2015-07-21 NOTE — ED Notes (Signed)
MD at bedside. 

## 2015-07-21 NOTE — Discharge Instructions (Signed)
Acute Urinary Retention, Male °Acute urinary retention is the temporary inability to urinate. °This is a common problem in older men. As men age their prostates become larger and block the flow of urine from the bladder. This is usually a problem that has come on gradually.  °HOME CARE INSTRUCTIONS °If you are sent home with a Foley catheter and a drainage system, you will need to discuss the best course of action with your health care provider. While the catheter is in, maintain a good intake of fluids. Keep the drainage bag emptied and lower than your catheter. This is so that contaminated urine will not flow back into your bladder, which could lead to a urinary tract infection. °There are two main types of drainage bags. One is a large bag that usually is used at night. It has a good capacity that will allow you to sleep through the night without having to empty it. The second type is called a leg bag. It has a smaller capacity, so it needs to be emptied more frequently. However, the main advantage is that it can be attached by a leg strap and can go underneath your clothing, allowing you the freedom to move about or leave your home. °Only take over-the-counter or prescription medicines for pain, discomfort, or fever as directed by your health care provider.  °SEEK MEDICAL CARE IF: °· You develop a low-grade fever. °· You experience spasms or leakage of urine with the spasms. °SEEK IMMEDIATE MEDICAL CARE IF:  °· You develop chills or fever. °· Your catheter stops draining urine. °· Your catheter falls out. °· You start to develop increased bleeding that does not respond to rest and increased fluid intake. °MAKE SURE YOU: °· Understand these instructions. °· Will watch your condition. °· Will get help right away if you are not doing well or get worse. °  °This information is not intended to replace advice given to you by your health care provider. Make sure you discuss any questions you have with your health care  provider. °  °Document Released: 03/30/2000 Document Revised: 05/08/2014 Document Reviewed: 06/02/2012 °Elsevier Interactive Patient Education ©2016 [employer] ° °

## 2015-07-21 NOTE — ED Provider Notes (Signed)
CSN: [id number]     Arrival date & time 07/20/15  2350 History  By signing my name below, I, Irene Pap, attest that this documentation has been prepared under the direction and in the presence of Leo Grosser, MD. Electronically Signed: Irene Pap, ED Scribe. 07/21/2015. 12:36 AM.   Chief Complaint  Patient presents with  . Flank Pain   Patient is a 55 y.o. male presenting with flank pain. The history is provided by the patient. No language interpreter was used.  Flank Pain This is a new problem. The current episode started 6 to 12 hours ago. The problem occurs constantly. The problem has been gradually worsening. Treatments tried: Toradol. The treatment provided mild relief.  HPI Comments: Jose Faulkner is a 55 y.o. Male with a hx of skin cancer brought in by [facility] who presents to the Emergency Department complaining of gradually worsening, sudden onset right flank pain onset 8 hours ago. Pt reports associated gradually worsening difficulty urinating and retention. He had a strong urge to urinate all day and it increased his pain. He states that he has had these symptoms in the past, but never to the point where his urine stream completely stopped. He was given 18 gauge in left AC and 30 mg Toradol en route for pain to relief. He denies nausea or vomiting. Pt is seen at [facility].  In 20 minutes, pt has put out 1100 CCs of urine via catheter. Pt had immediate relief after catheter.   Past Medical History  Diagnosis Date  . Allergy   . Sleep apnea, obstructive     sees Dr. Gwenette Greet, uses CPAP  . Skin cancer   . Elevated PSA     sees Dr. Jasmine December    Past Surgical History  Procedure Laterality Date  . Knee arthroscopy      left knee  . Basal cell carcinoma excision  Dec. 2012    per Dr. Sherrye Payor, scalp vertex    Family History  Problem Relation Age of Onset  . Colon cancer Neg Hx    Social History  Substance Use Topics  . Smoking status: Never Smoker   . Smokeless  tobacco: Never Used  . Alcohol Use: No    Review of Systems  Gastrointestinal: Negative for nausea and vomiting.  Genitourinary: Positive for flank pain, decreased urine volume and difficulty urinating.  All other systems reviewed and are negative.  Allergies  Review of patient's allergies indicates no known allergies.  Home Medications   Prior to Admission medications   Medication Sig Start Date End Date Taking? Authorizing Provider  cetirizine (ZYRTEC) 10 MG tablet Take 10 mg by mouth daily.      Historical Provider, MD  MOVIPREP 100 G SOLR Take 1 kit (200 g total) by mouth once. Patient not taking: Reported on 01/22/2014 01/16/13   Jerene Bears, MD  Multiple Vitamin (MULTIVITAMIN) tablet Take 1 tablet by mouth daily.    Historical Provider, MD   BP 167/86 mmHg  Pulse 60  Temp(Src) 98.7 F (37.1 C) (Oral)  Resp 22  SpO2 100% Physical Exam  Constitutional: He is oriented to person, place, and time. He appears well-developed and well-nourished. No distress.  HENT:  Head: Normocephalic and atraumatic.  Eyes: Conjunctivae are normal.  Neck: Neck supple. No tracheal deviation present.  Cardiovascular: Normal rate, regular rhythm and normal heart sounds.   Pulmonary/Chest: Effort normal. No respiratory distress.  Abdominal: Soft. He exhibits no distension. There is no tenderness. There is no rebound  and no guarding.  Neurological: He is alert and oriented to person, place, and time.  Skin: Skin is warm and dry.  Psychiatric: He has a normal mood and affect.  Nursing note and vitals reviewed.   ED Course  Procedures (including critical care time) DIAGNOSTIC STUDIES: Oxygen Saturation is 100% on RA, normal by my interpretation.    COORDINATION OF CARE: 12:33 AM-Discussed treatment plan which includes Flomax, foley catheter, leg bag, and follow up with urology with pt at bedside and pt agreed to plan.    Labs Review Labs Reviewed - No data to display  Imaging Review No  results found. I have personally reviewed and evaluated these images and lab results as part of my medical decision-making.   EKG Interpretation None      MDM   Final diagnoses:  Acute urinary retention    55 year old male presents with acute urinary retention with lower abdominal pain and back pain that started today. He has had retention issues before but usually has been able to clear without intervention. Foley catheter was placed with greater than 1100 mL of urine returned. Leg bag will be placed on patient to follow up with Dr. Jasmine December his urologist for removal after Flomax to help prevent recurrent retention. Return precautions discussed for worsening or new concerning symptoms.   I personally performed the services described in this documentation, which was scribed in my presence. The recorded information has been reviewed and is accurate.      Leo Grosser, MD 07/21/15 [phone]

## 2015-07-29 ENCOUNTER — Encounter (HOSPITAL_COMMUNITY): Payer: Self-pay | Admitting: Emergency Medicine

## 2015-07-29 ENCOUNTER — Emergency Department (HOSPITAL_COMMUNITY)
Admission: EM | Admit: 2015-07-29 | Discharge: 2015-07-30 | Disposition: A | Discharge status: Home / Self Care | Payer: Commercial Managed Care - HMO | Attending: Emergency Medicine | Admitting: Emergency Medicine

## 2015-07-29 DIAGNOSIS — R339 Retention of urine, unspecified: Secondary | ICD-10-CM | POA: Insufficient documentation

## 2015-07-29 DIAGNOSIS — Z7951 Long term (current) use of inhaled steroids: Secondary | ICD-10-CM | POA: Insufficient documentation

## 2015-07-29 DIAGNOSIS — R338 Other retention of urine: Secondary | ICD-10-CM

## 2015-07-29 DIAGNOSIS — Z85828 Personal history of other malignant neoplasm of skin: Secondary | ICD-10-CM | POA: Diagnosis not present

## 2015-07-29 DIAGNOSIS — Z79899 Other long term (current) drug therapy: Secondary | ICD-10-CM | POA: Diagnosis not present

## 2015-07-29 NOTE — ED Triage Notes (Signed)
Pt states that he suffered from urinary retention last week and had the foley removed today but had urinary retention tonight again since 8p. States he has an enlarged prostate. 700+ on bladder scan. Alert and oriented.

## 2015-07-30 LAB — URINALYSIS, ROUTINE W REFLEX MICROSCOPIC
Bilirubin Urine: NEGATIVE
Glucose, UA: NEGATIVE mg/dL
KETONES UR: NEGATIVE mg/dL
NITRITE: NEGATIVE
Protein, ur: NEGATIVE mg/dL
SPECIFIC GRAVITY, URINE: 1.007 (ref 1.005–1.030)
pH: 6 (ref 5.0–8.0)

## 2015-07-30 LAB — URINE MICROSCOPIC-ADD ON

## 2015-07-30 MED ORDER — CIPROFLOXACIN IN D5W 400 MG/200ML IV SOLN: 400.0000 mg | Freq: Once | INTRAVENOUS | Status: DC

## 2015-07-30 MED ORDER — CIPROFLOXACIN HCL 500 MG PO TABS
500.0000 mg | ORAL_TABLET | Freq: Once | ORAL | Status: AC
  Administered 2015-07-30: 500 mg via ORAL
  Filled 2015-07-30: qty 1

## 2015-07-30 MED ORDER — CIPROFLOXACIN HCL 500 MG PO TABS: 500.0000 mg | ORAL_TABLET | Freq: Two times a day (BID) | ORAL | 0 refills | Status: DC

## 2015-07-30 NOTE — ED Notes (Signed)
Patient was alert, oriented and stable upon discharge. RN went over AVS and patient had no further questions.  

## 2015-07-30 NOTE — ED Provider Notes (Signed)
[facility] DEPT Provider Note   CSN: [id number] Arrival date & time: 07/29/15  2305  First Provider Contact: 1:55 AM  First MD Initiated Contact with Patient 07/30/15 0154     By signing my name below, I, Jasmyn B. Alexander, attest that this documentation has been prepared under the direction and in the presence of Shanon Rosser, MD.  Electronically Signed: Tedra Coupe. Sheppard Coil, ED Scribe. 07/30/15. 2:06 AM.   History   Chief Complaint Chief Complaint  Patient presents with  . Urinary Retention    HPI HPI Comments: Jose Faulkner is a 55 y.o. male who presents to the Emergency Department complaining of acute urinary retention. Pt had a Foley catheter placed on 07/21/15 for a similar complaint which was removed on 07/28/15. Pt was voiding on his own after removal of Foley catheter until he really obstructive yesterday evening about 8 PM. He was having moderate to severe suprapubic discomfort on arrival. A new Foley catheter was placed by triage nurse in [facility] and she noted a clot which was likely the cause of the acute obstruction. His Foley catheter is now draining blood-tinged urine freely and his discomfort has improved. Pt has had associated chills. He denies any fever, nausea, vomiting or diarrhea.   The history is provided by the patient and the spouse. No language interpreter was used.    Past Medical History:  Diagnosis Date  . Allergy   . Elevated PSA    sees Dr. Jasmine December   . Skin cancer   . Sleep apnea, obstructive    sees Dr. Gwenette Greet, uses CPAP    Patient Active Problem List   Diagnosis Date Noted  . Hyperglycemia 07/25/2014  . Hyperlipidemia 07/25/2014  . OBSTRUCTIVE SLEEP APNEA 11/24/2007  . ALLERGIC RHINITIS 11/09/2007  . CHICKENPOX, HX OF 11/09/2007    Past Surgical History:  Procedure Laterality Date  . BASAL CELL CARCINOMA EXCISION  Dec. 2012   per Dr. Sherrye Payor, scalp vertex   . KNEE ARTHROSCOPY     left knee     Home Medications    Prior to  Admission medications   Medication Sig Start Date End Date Taking? Authorizing Provider  cetirizine (ZYRTEC) 10 MG tablet Take 10 mg by mouth daily.     Yes Historical Provider, MD  fluticasone (FLONASE) 50 MCG/ACT nasal spray Place 1 spray into both nostrils daily.   Yes Historical Provider, MD  Multiple Vitamin (MULTIVITAMIN) tablet Take 1 tablet by mouth daily.   Yes Historical Provider, MD  tamsulosin (FLOMAX) 0.4 MG CAPS capsule Take 1 capsule (0.4 mg total) by mouth daily. 07/21/15  Yes Leo Grosser, MD  ciprofloxacin (CIPRO) 500 MG tablet Take 1 tablet (500 mg total) by mouth 2 (two) times daily. One po bid x 7 days 07/30/15   Shanon Rosser, MD  MOVIPREP 100 G SOLR Take 1 kit (200 g total) by mouth once. Patient not taking: Reported on 01/22/2014 01/16/13   Jerene Bears, MD    Family History Family History  Problem Relation Age of Onset  . Colon cancer Neg Hx     Social History Social History  Substance Use Topics  . Smoking status: Never Smoker  . Smokeless tobacco: Never Used  . Alcohol use No     Allergies   Review of patient's allergies indicates no known allergies.   Review of Systems Review of Systems  All other systems reviewed and are negative.    Physical Exam Updated Vital Signs BP 164/91 (BP Location: Left Arm)  Pulse 71   Temp 98.3 F (36.8 C) (Oral)   Resp 18   Ht _0  (1.727 m)   Wt 180 lb (81.6 kg)   SpO2 100%   BMI 27.37 kg/m   Physical Exam General: Well-developed, well-nourished male in no acute distress; appearance consistent with age of record HENT: normocephalic; atraumatic Eyes: pupils equal, round and reactive to light; extraocular muscles intact Neck: supple Heart: regular rate and rhythm Lungs: clear to auscultation bilaterally Abdomen: soft; nondistended; suprapubic tenderness; no masses or hepatosplenomegaly; bowel sounds present GU: Foley catheter in place draining blood tinged urine Extremities: No deformity; full range of  motion; pulses normal Neurologic: Awake, alert and oriented; motor function intact in all extremities and symmetric; no facial droop Skin: Warm and dry Psychiatric: Normal mood and affect  ED Treatments / Results   Nursing notes and vitals signs, including pulse oximetry, reviewed.  Summary of this visit's results, reviewed by myself:  Labs:  Results for orders placed or performed during the hospital encounter of 07/29/15 (from the past 24 hour(s))  Urinalysis, Routine w reflex microscopic- may I&O cath if menses     Status: Abnormal   Collection Time: 07/29/15 11:18 PM  Result Value Ref Range   Color, Urine YELLOW YELLOW   APPearance CLOUDY (A) CLEAR   Specific Gravity, Urine 1.007 1.005 - 1.030   pH 6.0 5.0 - 8.0   Glucose, UA NEGATIVE NEGATIVE mg/dL   Hgb urine dipstick LARGE (A) NEGATIVE   Bilirubin Urine NEGATIVE NEGATIVE   Ketones, ur NEGATIVE NEGATIVE mg/dL   Protein, ur NEGATIVE NEGATIVE mg/dL   Nitrite NEGATIVE NEGATIVE   Leukocytes, UA MODERATE (A) NEGATIVE  Urine microscopic-add on     Status: Abnormal   Collection Time: 07/29/15 11:18 PM  Result Value Ref Range   Squamous Epithelial / LPF 0-5 (A) NONE SEEN   WBC, UA 6-30 0 - 5 WBC/hpf   RBC / HPF 6-30 0 - 5 RBC/hpf   Bacteria, UA MANY (A) NONE SEEN    Procedures Procedures (including critical care time)   Final Clinical Impressions(s) / ED Diagnoses  Urine sent for culture. We'll place on Cipro as the patient has had chills, suprapubic tenderness, and his UA is suggestive of a nascent infection.  Final diagnoses:  Acute urinary retention   I personally performed the services described in this documentation, which was scribed in my presence. The recorded information has been reviewed and is accurate.     Shanon Rosser, MD 07/30/15 [phone]

## 2015-08-01 LAB — URINE CULTURE: Culture: >=100000 — AB

## 2015-08-02 ENCOUNTER — Telehealth (HOSPITAL_BASED_OUTPATIENT_CLINIC_OR_DEPARTMENT_OTHER): Payer: Self-pay | Admitting: *Deleted

## 2015-08-02 ENCOUNTER — Telehealth (HOSPITAL_BASED_OUTPATIENT_CLINIC_OR_DEPARTMENT_OTHER): Payer: Self-pay

## 2015-08-02 NOTE — Telephone Encounter (Signed)
Positive urine culture treated with ciprofloxacin no change needed per Milus Glazier PharmD.

## 2015-09-23 ENCOUNTER — Ambulatory Visit (INDEPENDENT_AMBULATORY_CARE_PROVIDER_SITE_OTHER): Payer: Commercial Managed Care - HMO | Admitting: Family Medicine

## 2015-09-23 ENCOUNTER — Encounter: Payer: Self-pay | Admitting: Family Medicine

## 2015-09-23 ENCOUNTER — Other Ambulatory Visit: Payer: Self-pay | Admitting: Family Medicine

## 2015-09-23 VITALS — BP 97/63 | HR 87 | Temp 98.1°F | Ht 68.0 in | Wt 175.0 lb

## 2015-09-23 DIAGNOSIS — R509 Fever, unspecified: Secondary | ICD-10-CM

## 2015-09-23 DIAGNOSIS — Z0000 Encounter for general adult medical examination without abnormal findings: Secondary | ICD-10-CM

## 2015-09-23 LAB — CBC WITH DIFFERENTIAL/PLATELET
BASOS PCT: 0.4 % (ref 0.0–3.0)
Basophils Absolute: 0 10*3/uL (ref 0.0–0.1)
EOS ABS: 0 10*3/uL (ref 0.0–0.7)
Eosinophils Relative: 0 % (ref 0.0–5.0)
HEMATOCRIT: 41.7 % (ref 39.0–52.0)
HEMOGLOBIN: 14.6 g/dL (ref 13.0–17.0)
LYMPHS PCT: 10.6 % — AB (ref 12.0–46.0)
Lymphs Abs: 0.3 10*3/uL — ABNORMAL LOW (ref 0.7–4.0)
MCHC: 34.9 g/dL (ref 30.0–36.0)
MCV: 90.3 fl (ref 78.0–100.0)
Monocytes Absolute: 0.2 10*3/uL (ref 0.1–1.0)
Monocytes Relative: 7.4 % (ref 3.0–12.0)
Neutro Abs: 2.4 10*3/uL (ref 1.4–7.7)
Neutrophils Relative %: 81.6 % — ABNORMAL HIGH (ref 43.0–77.0)
Platelets: 93 10*3/uL — ABNORMAL LOW (ref 150.0–400.0)
RBC: 4.62 Mil/uL (ref 4.22–5.81)
RDW: 12.8 % (ref 11.5–15.5)
WBC: 2.9 10*3/uL — AB (ref 4.0–10.5)

## 2015-09-23 MED ORDER — DOXYCYCLINE HYCLATE 100 MG PO CAPS: 100.0000 mg | ORAL_CAPSULE | Freq: Two times a day (BID) | ORAL | 0 refills | Status: AC

## 2015-09-23 NOTE — Progress Notes (Signed)
   Subjective:    Patient ID: Jose Faulkner, male    DOB: [birth year], 55 y.o.   MRN: [id number]  HPI Here for 3 days of fevers to 104.5 degrees, body aches (especially shoulders and knees), and headaches. No NVD, no cough or ST. No rashes. He has been treated for several UTIs in the past few months per Dr. Louis Meckel in Urology. One culture grew Klebsiella and the other grew E coli. He was treated wit 2 courses of Cipro and now has just finished a course of Bactrim. He had a Foley catheter for a few weeks but this is out now. He is urinating normally. He also says he pulled a tick off his right inner thigh 2 weeks ago.     Review of Systems  Constitutional: Positive for chills, diaphoresis and fever.  HENT: Negative.   Eyes: Negative.   Respiratory: Negative.   Cardiovascular: Negative.   Gastrointestinal: Negative.   Genitourinary: Negative.   Neurological: Negative.        Objective:   Physical Exam  Constitutional: He is oriented to person, place, and time. He appears well-developed and well-nourished. No distress.  Neck: No thyromegaly present.  Cardiovascular: Normal rate, regular rhythm, normal heart sounds and intact distal pulses.   Pulmonary/Chest: Effort normal and breath sounds normal. No respiratory distress. He has no wheezes. He has no rales.  Abdominal: Soft. Bowel sounds are normal. He exhibits no distension and no mass. There is no tenderness. There is no rebound and no guarding.  Lymphadenopathy:    He has no cervical adenopathy.  Neurological: He is alert and oriented to person, place, and time.  Skin: No rash noted. No erythema.          Assessment & Plan:  He is experiencing fevers with headaches and body aches, which could be from a viral illness but could also be from rickettsial disease. We will get a CBC and a Lyme titer today, and we will cover him with Doxycycline for 10 days. He will notify Urology about this. Written out of work today and tomorrow.

## 2015-09-23 NOTE — Progress Notes (Signed)
Pre visit review using our clinic review tool, if applicable. No additional management support is needed unless otherwise documented below in the visit note. 

## 2015-09-24 LAB — LYME AB/WESTERN BLOT REFLEX: B burgdorferi Ab IgG+IgM: <0.9 Index (ref <0.90)

## 2015-10-05 IMAGING — CR DG ORBITS FOR FOREIGN BODY
2 series · 2 of 2 positions shown · non-contrast
Comparison: None.

CLINICAL DATA: Metal working/exposure; clearance prior to MRI

EXAM:
ORBITS FOR FOREIGN BODY - 2 VIEW

[w waters (1 of 2)]
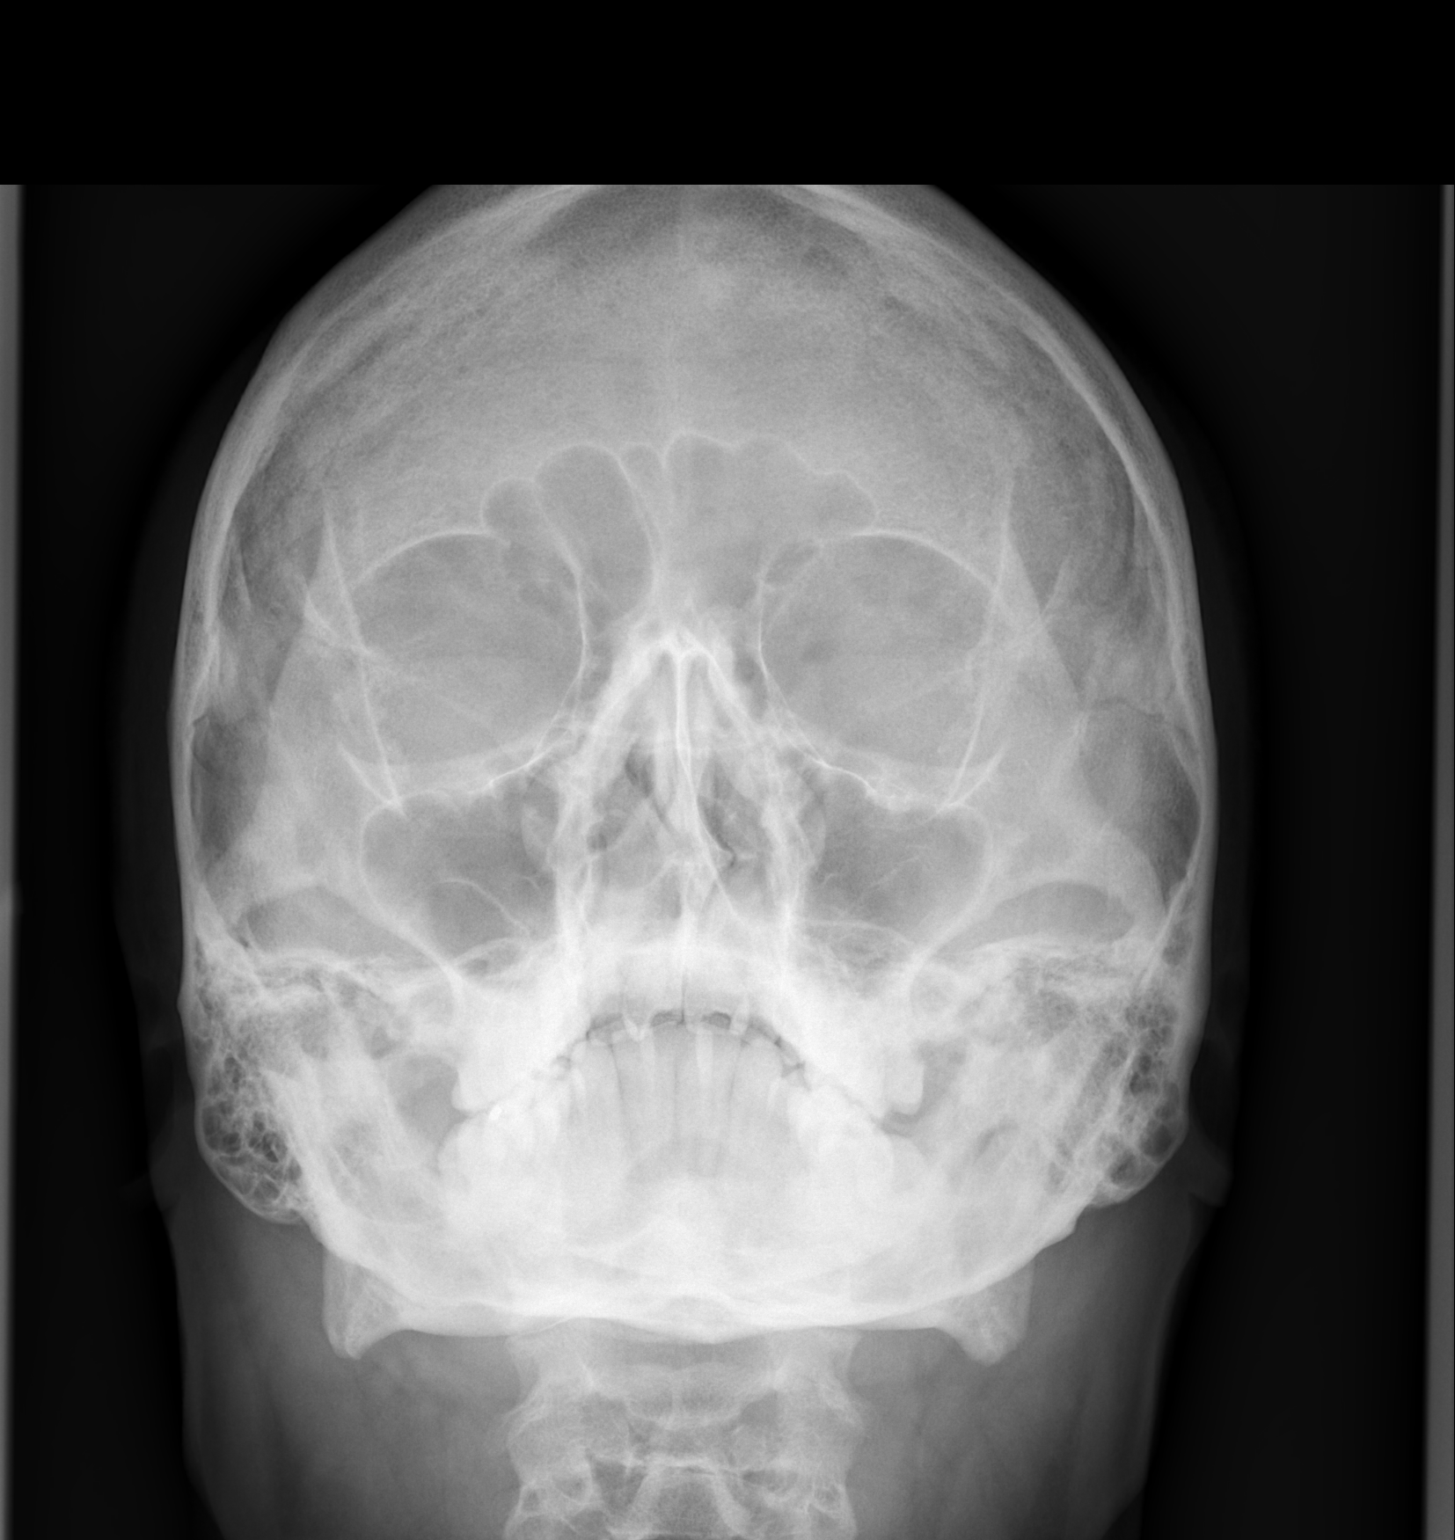

[w waters (2 of 2)]
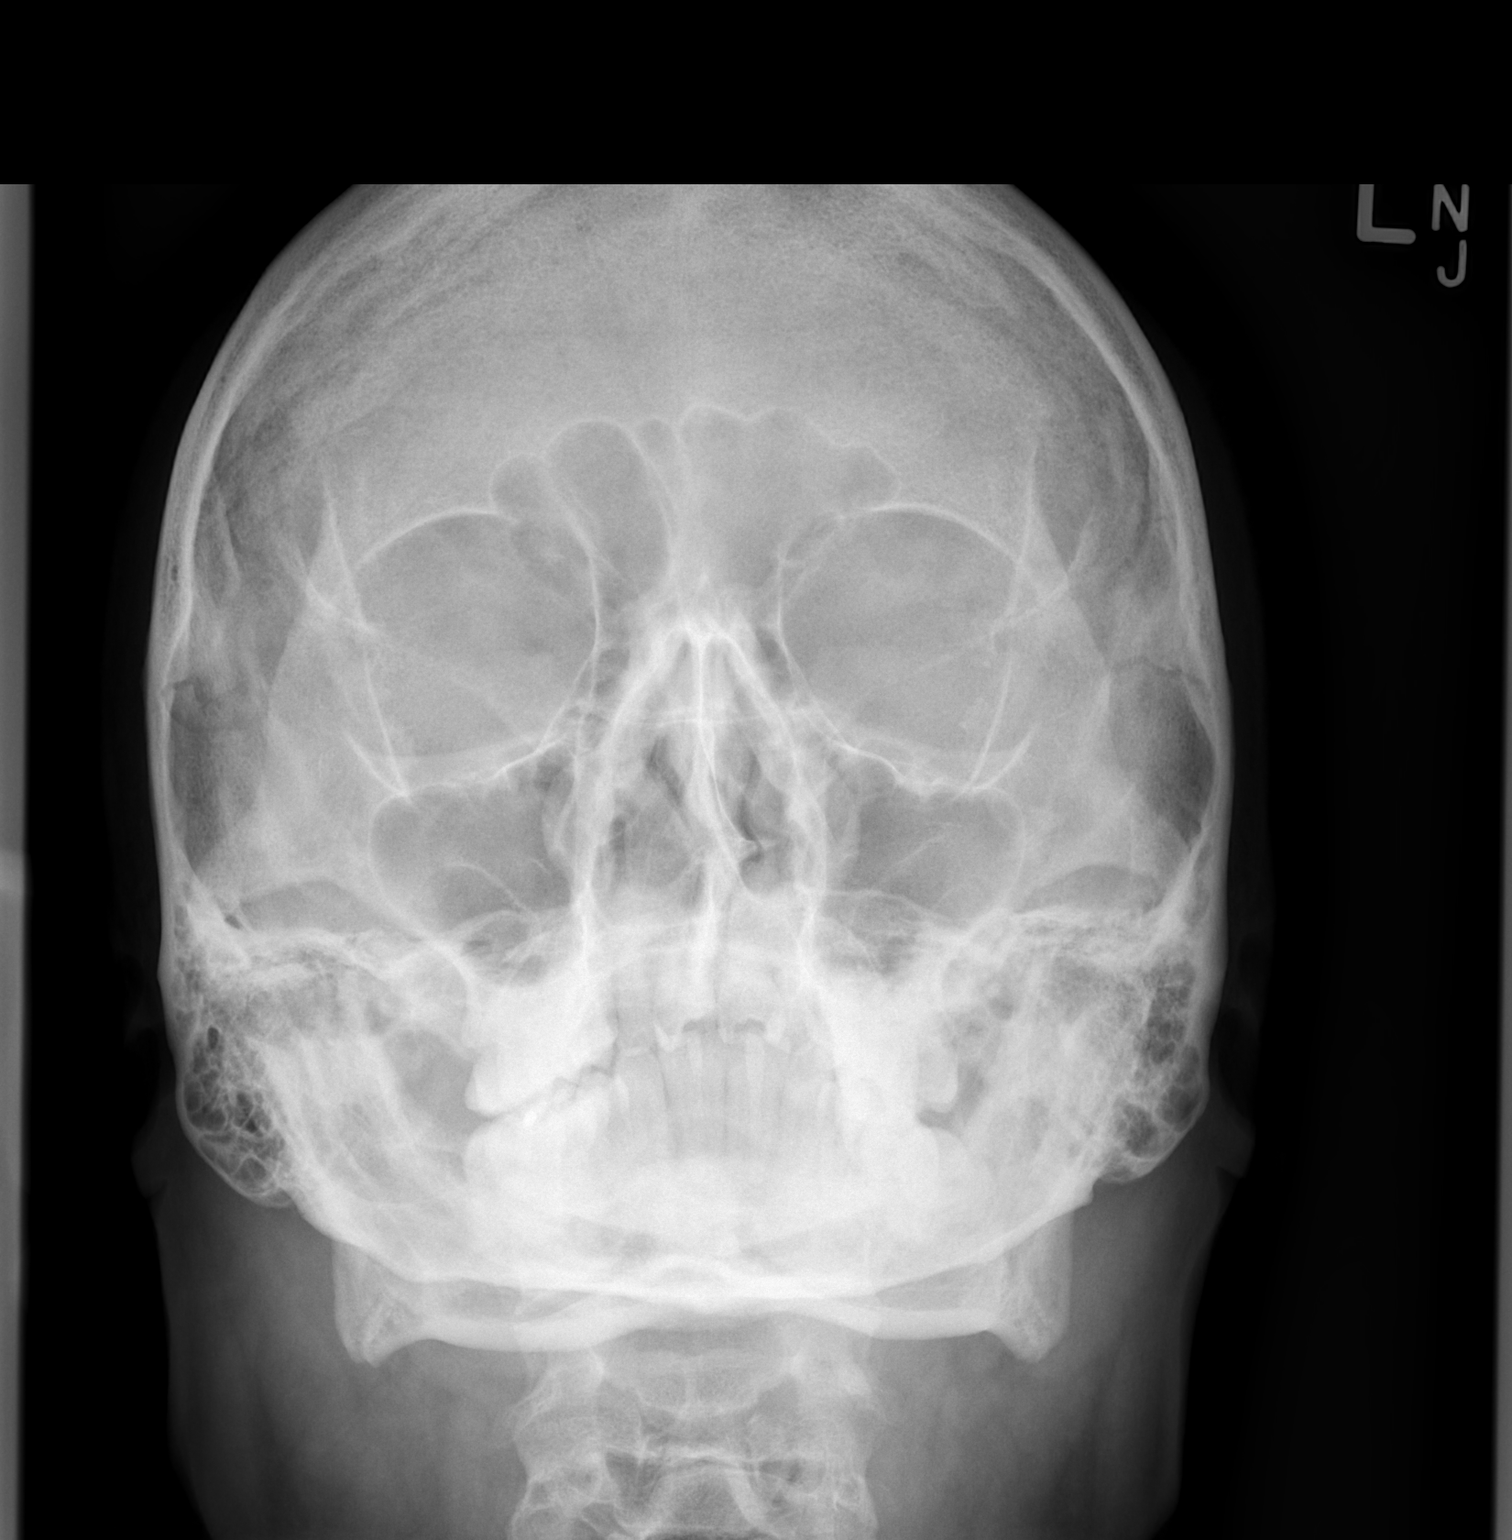

[2 of 2 positions shown; findings below may reference images not displayed]

FINDINGS: There is no evidence of metallic foreign body within the orbits. No
significant bone abnormality identified.
IMPRESSION: No evidence of metallic foreign body within the orbits.

## 2016-06-04 DIAGNOSIS — L821 Other seborrheic keratosis: Secondary | ICD-10-CM | POA: Diagnosis not present

## 2016-06-04 DIAGNOSIS — B078 Other viral warts: Secondary | ICD-10-CM | POA: Diagnosis not present

## 2016-06-04 DIAGNOSIS — Z85828 Personal history of other malignant neoplasm of skin: Secondary | ICD-10-CM | POA: Diagnosis not present

## 2016-06-04 DIAGNOSIS — D225 Melanocytic nevi of trunk: Secondary | ICD-10-CM | POA: Diagnosis not present

## 2016-10-27 DIAGNOSIS — R972 Elevated prostate specific antigen [PSA]: Secondary | ICD-10-CM | POA: Diagnosis not present

## 2016-11-02 DIAGNOSIS — R972 Elevated prostate specific antigen [PSA]: Secondary | ICD-10-CM | POA: Diagnosis not present

## 2016-11-02 DIAGNOSIS — N138 Other obstructive and reflux uropathy: Secondary | ICD-10-CM | POA: Diagnosis not present

## 2016-11-02 DIAGNOSIS — N401 Enlarged prostate with lower urinary tract symptoms: Secondary | ICD-10-CM | POA: Diagnosis not present

## 2017-06-04 DIAGNOSIS — D2272 Melanocytic nevi of left lower limb, including hip: Secondary | ICD-10-CM | POA: Diagnosis not present

## 2017-06-04 DIAGNOSIS — L821 Other seborrheic keratosis: Secondary | ICD-10-CM | POA: Diagnosis not present

## 2017-06-04 DIAGNOSIS — Z85828 Personal history of other malignant neoplasm of skin: Secondary | ICD-10-CM | POA: Diagnosis not present

## 2017-11-09 DIAGNOSIS — N401 Enlarged prostate with lower urinary tract symptoms: Secondary | ICD-10-CM | POA: Diagnosis not present

## 2017-11-09 DIAGNOSIS — R350 Frequency of micturition: Secondary | ICD-10-CM | POA: Diagnosis not present

## 2017-11-15 DIAGNOSIS — N401 Enlarged prostate with lower urinary tract symptoms: Secondary | ICD-10-CM | POA: Diagnosis not present

## 2017-11-15 DIAGNOSIS — R350 Frequency of micturition: Secondary | ICD-10-CM | POA: Diagnosis not present

## 2017-12-22 ENCOUNTER — Ambulatory Visit (INDEPENDENT_AMBULATORY_CARE_PROVIDER_SITE_OTHER): Payer: 59 | Admitting: Family Medicine

## 2017-12-22 ENCOUNTER — Encounter: Payer: Self-pay | Admitting: Family Medicine

## 2017-12-22 VITALS — BP 124/82 | HR 56 | Temp 97.4°F | Ht 68.5 in | Wt 199.1 lb

## 2017-12-22 DIAGNOSIS — R739 Hyperglycemia, unspecified: Secondary | ICD-10-CM | POA: Diagnosis not present

## 2017-12-22 DIAGNOSIS — Z0000 Encounter for general adult medical examination without abnormal findings: Secondary | ICD-10-CM | POA: Diagnosis not present

## 2017-12-22 LAB — CBC WITH DIFFERENTIAL/PLATELET
BASOS ABS: 0.1 10*3/uL (ref 0.0–0.1)
Basophils Relative: 0.9 % (ref 0.0–3.0)
Eosinophils Absolute: 0.2 10*3/uL (ref 0.0–0.7)
Eosinophils Relative: 2.9 % (ref 0.0–5.0)
HEMATOCRIT: 42.6 % (ref 39.0–52.0)
HEMOGLOBIN: 14.5 g/dL (ref 13.0–17.0)
LYMPHS PCT: 32.5 % (ref 12.0–46.0)
Lymphs Abs: 2.3 10*3/uL (ref 0.7–4.0)
MCHC: 34 g/dL (ref 30.0–36.0)
MCV: 93.8 fl (ref 78.0–100.0)
MONO ABS: 0.6 10*3/uL (ref 0.1–1.0)
Monocytes Relative: 8.6 % (ref 3.0–12.0)
Neutro Abs: 3.8 10*3/uL (ref 1.4–7.7)
Neutrophils Relative %: 55.1 % (ref 43.0–77.0)
Platelets: 198 10*3/uL (ref 150.0–400.0)
RBC: 4.54 Mil/uL (ref 4.22–5.81)
RDW: 13.1 % (ref 11.5–15.5)
WBC: 7 10*3/uL (ref 4.0–10.5)

## 2017-12-22 LAB — POC URINALSYSI DIPSTICK (AUTOMATED)
Bilirubin, UA: NEGATIVE
Glucose, UA: NEGATIVE
Ketones, UA: NEGATIVE
Leukocytes, UA: NEGATIVE
Nitrite, UA: NEGATIVE
PH UA: 6 (ref 5.0–8.0)
PROTEIN UA: POSITIVE — AB
RBC UA: NEGATIVE
SPEC GRAV UA: 1.025 (ref 1.010–1.025)
UROBILINOGEN UA: 0.2 U/dL

## 2017-12-22 LAB — HEPATIC FUNCTION PANEL
ALBUMIN: 4.7 g/dL (ref 3.5–5.2)
ALT: 35 U/L (ref 0–53)
AST: 22 U/L (ref 0–37)
Alkaline Phosphatase: 72 U/L (ref 39–117)
Bilirubin, Direct: 0.1 mg/dL (ref 0.0–0.3)
Total Bilirubin: 0.4 mg/dL (ref 0.2–1.2)
Total Protein: 7.8 g/dL (ref 6.0–8.3)

## 2017-12-22 LAB — BASIC METABOLIC PANEL
BUN: 18 mg/dL (ref 6–23)
CALCIUM: 9.4 mg/dL (ref 8.4–10.5)
CO2: 27 meq/L (ref 19–32)
CREATININE: 0.77 mg/dL (ref 0.40–1.50)
Chloride: 105 mEq/L (ref 96–112)
GFR: 111.33 mL/min (ref >60.00)
GLUCOSE: 111 mg/dL — AB (ref 70–99)
Potassium: 4.4 mEq/L (ref 3.5–5.1)
Sodium: 140 mEq/L (ref 135–145)

## 2017-12-22 LAB — LIPID PANEL
Cholesterol: 211 mg/dL — ABNORMAL HIGH (ref 0–200)
HDL: 47.3 mg/dL (ref >39.00)
LDL Cholesterol: 137 mg/dL — ABNORMAL HIGH (ref 0–99)
NonHDL: 163.54
Total CHOL/HDL Ratio: 4
Triglycerides: 133 mg/dL (ref 0.0–149.0)
VLDL: 26.6 mg/dL (ref 0.0–40.0)

## 2017-12-22 LAB — HEMOGLOBIN A1C: HEMOGLOBIN A1C: 5.9 % (ref 4.6–6.5)

## 2017-12-22 LAB — TSH: TSH: 1.46 u[IU]/mL (ref 0.35–4.50)

## 2017-12-22 NOTE — Progress Notes (Signed)
   Subjective:    Patient ID: Jose Faulkner, male    DOB: Feb 08, 1960, 57 y.o.   MRN: [id number]  HPI Here for a well exam. He feels great. He sees Dr. Louis Meckel for regular prostate exams and PSA checks. He has never had a colonoscopy.    Review of Systems  Constitutional: Negative.   HENT: Negative.   Eyes: Negative.   Respiratory: Negative.   Cardiovascular: Negative.   Gastrointestinal: Negative.   Genitourinary: Negative.   Musculoskeletal: Negative.   Skin: Negative.   Neurological: Negative.   Psychiatric/Behavioral: Negative.        Objective:   Physical Exam Constitutional:      General: He is not in acute distress.    Appearance: He is well-developed. He is not diaphoretic.  HENT:     Head: Normocephalic and atraumatic.     Right Ear: External ear normal.     Left Ear: External ear normal.     Nose: Nose normal.     Mouth/Throat:     Pharynx: No oropharyngeal exudate.  Eyes:     General: No scleral icterus.       Right eye: No discharge.        Left eye: No discharge.     Conjunctiva/sclera: Conjunctivae normal.     Pupils: Pupils are equal, round, and reactive to light.  Neck:     Musculoskeletal: Neck supple.     Thyroid: No thyromegaly.     Vascular: No JVD.     Trachea: No tracheal deviation.  Cardiovascular:     Rate and Rhythm: Normal rate and regular rhythm.     Heart sounds: Normal heart sounds. No murmur. No friction rub. No gallop.   Pulmonary:     Effort: Pulmonary effort is normal. No respiratory distress.     Breath sounds: Normal breath sounds. No wheezing or rales.  Chest:     Chest wall: No tenderness.  Abdominal:     General: Bowel sounds are normal. There is no distension.     Palpations: Abdomen is soft. There is no mass.     Tenderness: There is no abdominal tenderness. There is no guarding or rebound.  Genitourinary:    Penis: No tenderness.   Musculoskeletal: Normal range of motion.        General: No tenderness.    Lymphadenopathy:     Cervical: No cervical adenopathy.  Skin:    General: Skin is warm and dry.     Coloration: Skin is not pale.     Findings: No erythema or rash.  Neurological:     Mental Status: He is alert and oriented to person, place, and time.     Cranial Nerves: No cranial nerve deficit.     Motor: No abnormal muscle tone.     Coordination: Coordination normal.     Deep Tendon Reflexes: Reflexes are normal and symmetric. Reflexes normal.  Psychiatric:        Behavior: Behavior normal.        Thought Content: Thought content normal.        Judgment: Judgment normal.           Assessment & Plan:  Well exam. We discussed diet and exercise. Get fasting labs. Set up a colonoscopy.  Alysia Penna, MD

## 2018-02-15 ENCOUNTER — Encounter: Payer: Self-pay | Admitting: Gastroenterology

## 2018-02-24 ENCOUNTER — Ambulatory Visit (AMBULATORY_SURGERY_CENTER): Payer: Self-pay

## 2018-02-24 ENCOUNTER — Encounter: Payer: Self-pay | Admitting: Gastroenterology

## 2018-02-24 VITALS — Ht 69.0 in | Wt 203.6 lb

## 2018-02-24 DIAGNOSIS — Z1211 Encounter for screening for malignant neoplasm of colon: Secondary | ICD-10-CM

## 2018-02-24 MED ORDER — NA SULFATE-K SULFATE-MG SULF 17.5-3.13-1.6 GM/177ML PO SOLN: 1.0000 | Freq: Once | ORAL | 0 refills | Status: AC

## 2018-02-24 NOTE — Progress Notes (Signed)
Per pt, no allergies to soy or egg products.Pt not taking any weight loss meds or using  O2 at home.  Pt refused emmi video. 

## 2018-03-07 ENCOUNTER — Encounter: Payer: 59 | Admitting: Gastroenterology

## 2018-03-09 ENCOUNTER — Encounter: Payer: 59 | Admitting: Gastroenterology

## 2018-04-05 ENCOUNTER — Encounter: Payer: 59 | Admitting: Gastroenterology

## 2018-05-10 ENCOUNTER — Telehealth: Payer: Self-pay | Admitting: *Deleted

## 2018-05-10 NOTE — Telephone Encounter (Signed)
Called pt and LM to call us at [phone] to RS his colonoscopy with Dr Loletha Carrow - unable to reach pt today. Jose Faulkner PV

## 2018-07-05 ENCOUNTER — Encounter: Payer: Self-pay | Admitting: Gastroenterology

## 2018-07-06 ENCOUNTER — Encounter: Payer: Self-pay | Admitting: Gastroenterology

## 2018-09-05 ENCOUNTER — Encounter: Payer: Self-pay | Admitting: Gastroenterology

## 2018-09-26 ENCOUNTER — Ambulatory Visit (AMBULATORY_SURGERY_CENTER): Payer: Self-pay | Admitting: *Deleted

## 2018-09-26 ENCOUNTER — Other Ambulatory Visit: Payer: Self-pay

## 2018-09-26 VITALS — Temp 97.5°F | Ht 68.0 in | Wt 202.4 lb

## 2018-09-26 DIAGNOSIS — Z1211 Encounter for screening for malignant neoplasm of colon: Secondary | ICD-10-CM

## 2018-09-26 NOTE — Progress Notes (Signed)

## 2018-10-03 ENCOUNTER — Encounter: Payer: Self-pay | Admitting: Gastroenterology

## 2018-10-10 ENCOUNTER — Encounter: Payer: 59 | Admitting: Gastroenterology

## 2018-10-14 ENCOUNTER — Telehealth: Payer: Self-pay | Admitting: Gastroenterology

## 2018-10-14 NOTE — Telephone Encounter (Signed)

## 2018-10-17 ENCOUNTER — Other Ambulatory Visit: Payer: Self-pay | Admitting: Gastroenterology

## 2018-10-17 ENCOUNTER — Ambulatory Visit (AMBULATORY_SURGERY_CENTER): Payer: 59 | Admitting: Gastroenterology

## 2018-10-17 ENCOUNTER — Other Ambulatory Visit: Payer: Self-pay

## 2018-10-17 ENCOUNTER — Encounter: Payer: Self-pay | Admitting: Gastroenterology

## 2018-10-17 VITALS — BP 140/82 | HR 70 | Temp 98.3°F | Resp 14 | Ht 68.0 in | Wt 202.0 lb

## 2018-10-17 DIAGNOSIS — D123 Benign neoplasm of transverse colon: Secondary | ICD-10-CM

## 2018-10-17 DIAGNOSIS — K635 Polyp of colon: Secondary | ICD-10-CM

## 2018-10-17 DIAGNOSIS — Z1211 Encounter for screening for malignant neoplasm of colon: Secondary | ICD-10-CM

## 2018-10-17 HISTORY — PX: COLONOSCOPY: SHX174

## 2018-10-17 MED ORDER — SODIUM CHLORIDE 0.9 % IV SOLN: 500.0000 mL | Freq: Once | INTRAVENOUS | Status: DC

## 2018-10-17 NOTE — Progress Notes (Signed)
Temp by JB  N C v/s

## 2018-10-17 NOTE — Progress Notes (Signed)
No problems noted in the recovery room. maw 

## 2018-10-17 NOTE — Progress Notes (Signed)
Called to room to assist during endoscopic procedure.  Patient ID and intended procedure confirmed with present staff. Received instructions for my participation in the procedure from the performing physician.  

## 2018-10-17 NOTE — Op Note (Signed)
[facility] Patient Name: Jose Faulkner Procedure Date: 10/17/2018 8:48 AM MRN: [id number] Endoscopist: Sutter. Loletha Carrow , MD Age: 58 Referring MD:  Date of Birth: [birth year] Gender: Male Account #: [id number] Procedure:                Colonoscopy Indications:              Screening for colorectal malignant neoplasm, This                            is the patient's first colonoscopy Medicines:                Monitored Anesthesia Care Procedure:                Pre-Anesthesia Assessment:                           - Prior to the procedure, a History and Physical                            was performed, and patient medications and                            allergies were reviewed. The patient's tolerance of                            previous anesthesia was also reviewed. The risks                            and benefits of the procedure and the sedation                            options and risks were discussed with the patient.                            All questions were answered, and informed consent                            was obtained. Prior Anticoagulants: The patient has                            taken no previous anticoagulant or antiplatelet                            agents. ASA Grade Assessment: II - A patient with                            mild systemic disease. After reviewing the risks                            and benefits, the patient was deemed in                            satisfactory condition to undergo the procedure.  After obtaining informed consent, the colonoscope                            was passed under direct vision. Throughout the                            procedure, the patient's blood pressure, pulse, and                            oxygen saturations were monitored continuously. The                            Colonoscope was introduced through the anus and                            advanced to the the cecum,  identified by                            appendiceal orifice and ileocecal valve. The                            colonoscopy was performed without difficulty. The                            patient tolerated the procedure well. The quality                            of the bowel preparation was excellent. The                            ileocecal valve, appendiceal orifice, and rectum                            were photographed. Scope In: 8:55:18 AM Scope Out: 9:12:54 AM Scope Withdrawal Time: 0 hours 11 minutes 31 seconds  Total Procedure Duration: 0 hours 17 minutes 36 seconds  Findings:                 The perianal and digital rectal examinations were                            normal.                           A 5 mm polyp was found in the distal transverse                            colon. The polyp was sessile. The polyp was removed                            with a cold snare. Resection and retrieval were                            complete.  The exam was otherwise without abnormality on                            direct and retroflexion views. Complications:            No immediate complications. Estimated Blood Loss:     Estimated blood loss was minimal. Impression:               - One 5 mm polyp in the distal transverse colon,                            removed with a cold snare. Resected and retrieved.                           - The examination was otherwise normal on direct                            and retroflexion views. Recommendation:           - Patient has a contact number available for                            emergencies. The signs and symptoms of potential                            delayed complications were discussed with the                            patient. Return to normal activities tomorrow.                            Written discharge instructions were provided to the                            patient.                            - Resume previous diet.                           - Continue present medications.                           - Await pathology results.                           - Repeat colonoscopy is recommended for                            surveillance. The colonoscopy date will be                            determined after pathology results from today's                            exam become available for review. Henry L. Loletha Carrow, MD 10/17/2018 9:15:51 AM This report  has been signed electronically.

## 2018-10-17 NOTE — Progress Notes (Signed)
To PACU, VSS. Report to [email or web address] 

## 2018-10-17 NOTE — Patient Instructions (Signed)
YOU HAD AN ENDOSCOPIC PROCEDURE TODAY AT [facility]:   Refer to the procedure report that was given to you for any specific questions about what was found during the examination.  If the procedure report does not answer your questions, please call your gastroenterologist to clarify.  If you requested that your care partner not be given the details of your procedure findings, then the procedure report has been included in a sealed envelope for you to review at your convenience later.  YOU SHOULD EXPECT: Some feelings of bloating in the abdomen. Passage of more gas than usual.  Walking can help get rid of the air that was put into your GI tract during the procedure and reduce the bloating. If you had a lower endoscopy (such as a colonoscopy or flexible sigmoidoscopy) you may notice spotting of blood in your stool or on the toilet paper. If you underwent a bowel prep for your procedure, you may not have a normal bowel movement for a few days.  Please Note:  You might notice some irritation and congestion in your nose or some drainage.  This is from the oxygen used during your procedure.  There is no need for concern and it should clear up in a day or so.  SYMPTOMS TO REPORT IMMEDIATELY:   Following lower endoscopy (colonoscopy or flexible sigmoidoscopy):  Excessive amounts of blood in the stool  Significant tenderness or worsening of abdominal pains  Swelling of the abdomen that is new, acute  Fever of 100F or higher   For urgent or emergent issues, a gastroenterologist can be reached at any hour by calling [phone].   DIET:  We do recommend a small meal at first, but then you may proceed to your regular diet.  Drink plenty of fluids but you should avoid alcoholic beverages for 24 hours.  ACTIVITY:  You should plan to take it easy for the rest of today and you should NOT DRIVE or use heavy machinery until tomorrow (because of the sedation medicines used during the test).     FOLLOW UP: Our staff will call the number listed on your records 48-72 hours following your procedure to check on you and address any questions or concerns that you may have regarding the information given to you following your procedure. If we do not reach you, we will leave a message.  We will attempt to reach you two times.  During this call, we will ask if you have developed any symptoms of COVID 19. If you develop any symptoms (ie: fever, flu-like symptoms, shortness of breath, cough etc.) before then, please call [phone].  If you test positive for Covid 19 in the 2 weeks post procedure, please call and report this information to [place].    If any biopsies were taken you will be contacted by phone or by letter within the next 1-3 weeks.  Please call us at [phone] if you have not heard about the biopsies in 3 weeks.    SIGNATURES/CONFIDENTIALITY: You and/or your care partner have signed paperwork which will be entered into your electronic medical record.  These signatures attest to the fact that that the information above on your After Visit Summary has been reviewed and is understood.  Full responsibility of the confidentiality of this discharge information lies with you and/or your care-partner.     Handout was given to you on polyp. You may resume your current medications today. Await biopsy results. Please call if any questions or  concerns.

## 2018-10-19 ENCOUNTER — Telehealth: Payer: Self-pay

## 2018-10-19 NOTE — Telephone Encounter (Signed)
Left message on follow up call. 

## 2018-10-19 NOTE — Telephone Encounter (Signed)
  Follow up Call-  Call back number 10/17/2018  Post procedure Call Back phone  # [phone]  Permission to leave phone message Yes  Some recent data might be hidden     Patient questions:  Do you have a fever, pain , or abdominal swelling? No. Pain Score  0 *  Have you tolerated food without any problems? Yes.    Have you been able to return to your normal activities? Yes.    Do you have any questions about your discharge instructions: Diet   No. Medications  No. Follow up visit  No.  Do you have questions or concerns about your Care? No.  Actions: * If pain score is 4 or above: 1. No action needed, pain <4.Have you developed a fever since your procedure? no  2.   Have you had an respiratory symptoms (SOB or cough) since your procedure? no  3.   Have you tested positive for COVID 19 since your procedure no  4.   Have you had any family members/close contacts diagnosed with the COVID 19 since your procedure?  no   If yes to any of these questions please route to Joylene John, RN and Alphonsa Gin, Therapist, sports.

## 2018-10-27 ENCOUNTER — Encounter: Payer: Self-pay | Admitting: Gastroenterology

## 2019-03-07 ENCOUNTER — Other Ambulatory Visit: Payer: Self-pay

## 2019-03-08 ENCOUNTER — Ambulatory Visit (INDEPENDENT_AMBULATORY_CARE_PROVIDER_SITE_OTHER): Payer: 59 | Admitting: Family Medicine

## 2019-03-08 ENCOUNTER — Encounter: Payer: Self-pay | Admitting: Family Medicine

## 2019-03-08 VITALS — BP 150/80 | HR 72 | Temp 98.0°F | Ht 68.0 in | Wt 199.6 lb

## 2019-03-08 DIAGNOSIS — R739 Hyperglycemia, unspecified: Secondary | ICD-10-CM | POA: Diagnosis not present

## 2019-03-08 DIAGNOSIS — Z0000 Encounter for general adult medical examination without abnormal findings: Secondary | ICD-10-CM

## 2019-03-08 LAB — TSH: TSH: 1.22 u[IU]/mL (ref 0.35–4.50)

## 2019-03-08 LAB — BASIC METABOLIC PANEL
BUN: 14 mg/dL (ref 6–23)
CO2: 27 mEq/L (ref 19–32)
Calcium: 9.2 mg/dL (ref 8.4–10.5)
Chloride: 105 mEq/L (ref 96–112)
Creatinine, Ser: 0.69 mg/dL (ref 0.40–1.50)
GFR: 118.37 mL/min (ref >60.00)
Glucose, Bld: 103 mg/dL — ABNORMAL HIGH (ref 70–99)
Potassium: 4.3 mEq/L (ref 3.5–5.1)
Sodium: 139 mEq/L (ref 135–145)

## 2019-03-08 LAB — CBC WITH DIFFERENTIAL/PLATELET
Basophils Absolute: 0.1 10*3/uL (ref 0.0–0.1)
Basophils Relative: 0.9 % (ref 0.0–3.0)
Eosinophils Absolute: 0.1 10*3/uL (ref 0.0–0.7)
Eosinophils Relative: 1.8 % (ref 0.0–5.0)
HCT: 41.7 % (ref 39.0–52.0)
Hemoglobin: 14.1 g/dL (ref 13.0–17.0)
Lymphocytes Relative: 31.5 % (ref 12.0–46.0)
Lymphs Abs: 2.1 10*3/uL (ref 0.7–4.0)
MCHC: 33.9 g/dL (ref 30.0–36.0)
MCV: 93.4 fl (ref 78.0–100.0)
Monocytes Absolute: 0.5 10*3/uL (ref 0.1–1.0)
Monocytes Relative: 7.5 % (ref 3.0–12.0)
Neutro Abs: 3.9 10*3/uL (ref 1.4–7.7)
Neutrophils Relative %: 58.3 % (ref 43.0–77.0)
Platelets: 175 10*3/uL (ref 150.0–400.0)
RBC: 4.47 Mil/uL (ref 4.22–5.81)
RDW: 12.7 % (ref 11.5–15.5)
WBC: 6.6 10*3/uL (ref 4.0–10.5)

## 2019-03-08 LAB — LIPID PANEL
Cholesterol: 210 mg/dL — ABNORMAL HIGH (ref 0–200)
HDL: 45.7 mg/dL (ref >39.00)
LDL Cholesterol: 139 mg/dL — ABNORMAL HIGH (ref 0–99)
NonHDL: 164.64
Total CHOL/HDL Ratio: 5
Triglycerides: 129 mg/dL (ref 0.0–149.0)
VLDL: 25.8 mg/dL (ref 0.0–40.0)

## 2019-03-08 LAB — HEPATIC FUNCTION PANEL
ALT: 42 U/L (ref 0–53)
AST: 26 U/L (ref 0–37)
Albumin: 4.2 g/dL (ref 3.5–5.2)
Alkaline Phosphatase: 77 U/L (ref 39–117)
Bilirubin, Direct: 0.1 mg/dL (ref 0.0–0.3)
Total Bilirubin: 0.5 mg/dL (ref 0.2–1.2)
Total Protein: 7.3 g/dL (ref 6.0–8.3)

## 2019-03-08 LAB — URINALYSIS, ROUTINE W REFLEX MICROSCOPIC
Bilirubin Urine: NEGATIVE
Ketones, ur: NEGATIVE
Leukocytes,Ua: NEGATIVE
Nitrite: NEGATIVE
Specific Gravity, Urine: >=1.03 — AB (ref 1.000–1.030)
Total Protein, Urine: 100 — AB
Urine Glucose: NEGATIVE
Urobilinogen, UA: 0.2 (ref 0.0–1.0)
pH: 6 (ref 5.0–8.0)

## 2019-03-08 LAB — HEMOGLOBIN A1C: Hgb A1c MFr Bld: 6.3 % (ref 4.6–6.5)

## 2019-03-08 NOTE — Progress Notes (Signed)
Subjective:    Patient ID: Jose Faulkner, male    DOB: Jun 08, 1960, 59 y.o.   MRN: [id number]  HPI Here for a well exam. He feels great. He notes that he saw Dr. Louis Meckel last month for a urology follow up. His PSA is stable, but his urine had some blood in it. He has never had urinary symptoms. He chewed tobacco briefly in high school, but he has never smoked. He had a normal renal CT scan in the urology office. Dr. Louis Meckel also wanted to do a cystoscopy, but [facility] was hesitant to do this. They agreed to simply monitor this for now.    Review of Systems  Constitutional: Negative.   HENT: Negative.   Eyes: Negative.   Respiratory: Negative.   Cardiovascular: Negative.   Gastrointestinal: Negative.   Genitourinary: Negative.   Musculoskeletal: Negative.   Skin: Negative.   Neurological: Negative.   Psychiatric/Behavioral: Negative.        Objective:   Physical Exam Constitutional:      General: He is not in acute distress.    Appearance: He is well-developed. He is not diaphoretic.  HENT:     Head: Normocephalic and atraumatic.     Right Ear: External ear normal.     Left Ear: External ear normal.     Nose: Nose normal.     Mouth/Throat:     Pharynx: No oropharyngeal exudate.  Eyes:     General: No scleral icterus.       Right eye: No discharge.        Left eye: No discharge.     Conjunctiva/sclera: Conjunctivae normal.     Pupils: Pupils are equal, round, and reactive to light.  Neck:     Thyroid: No thyromegaly.     Vascular: No JVD.     Trachea: No tracheal deviation.  Cardiovascular:     Rate and Rhythm: Normal rate and regular rhythm.     Heart sounds: Normal heart sounds. No murmur. No friction rub. No gallop.   Pulmonary:     Effort: Pulmonary effort is normal. No respiratory distress.     Breath sounds: Normal breath sounds. No wheezing or rales.  Chest:     Chest wall: No tenderness.  Abdominal:     General: Bowel sounds are normal. There is no distension.       Palpations: Abdomen is soft. There is no mass.     Tenderness: There is no abdominal tenderness. There is no guarding or rebound.  Genitourinary:    Penis: No tenderness.      Testes: Normal.  Musculoskeletal:        General: No tenderness. Normal range of motion.     Cervical back: Neck supple.  Lymphadenopathy:     Cervical: No cervical adenopathy.  Skin:    General: Skin is warm and dry.     Coloration: Skin is not pale.     Findings: No erythema or rash.  Neurological:     Mental Status: He is alert and oriented to person, place, and time.     Cranial Nerves: No cranial nerve deficit.     Motor: No abnormal muscle tone.     Coordination: Coordination normal.     Deep Tendon Reflexes: Reflexes are normal and symmetric. Reflexes normal.  Psychiatric:        Behavior: Behavior normal.        Thought Content: Thought content normal.        Judgment: Judgment normal.  Assessment & Plan:  Well exam. We discussed diet and exercise. Get fasting labs. We will include another UA to look for signs of blood.  Alysia Penna, MD

## 2019-12-09 ENCOUNTER — Ambulatory Visit: Payer: 59 | Attending: Internal Medicine

## 2019-12-09 DIAGNOSIS — Z23 Encounter for immunization: Secondary | ICD-10-CM

## 2019-12-09 NOTE — Progress Notes (Signed)
   Covid-19 Vaccination Clinic  Name:  Jose Faulkner    MRN: [id number] DOB: May 18, 1960  12/09/2019  [name] was observed post Covid-19 immunization for 15 minutes without incident. He was provided with Vaccine Information Sheet and instruction to access the V-Safe system.   [name] was instructed to call 911 with any severe reactions post vaccine: Marland Kitchen Difficulty breathing  . Swelling of face and throat  . A fast heartbeat  . A bad rash all over body  . Dizziness and weakness   Immunizations Administered    No immunizations on file.

## 2020-11-07 ENCOUNTER — Encounter: Payer: 59 | Admitting: Family Medicine

## 2020-12-09 ENCOUNTER — Ambulatory Visit (INDEPENDENT_AMBULATORY_CARE_PROVIDER_SITE_OTHER): Payer: 59 | Admitting: Family Medicine

## 2020-12-09 ENCOUNTER — Encounter: Payer: Self-pay | Admitting: Family Medicine

## 2020-12-09 VITALS — BP 130/84 | HR 73 | Temp 97.6°F | Ht 68.0 in | Wt 203.0 lb

## 2020-12-09 DIAGNOSIS — Z23 Encounter for immunization: Secondary | ICD-10-CM

## 2020-12-09 DIAGNOSIS — Z0000 Encounter for general adult medical examination without abnormal findings: Secondary | ICD-10-CM

## 2020-12-09 LAB — BASIC METABOLIC PANEL
BUN: 18 mg/dL (ref 6–23)
CO2: 26 mEq/L (ref 19–32)
Calcium: 9.3 mg/dL (ref 8.4–10.5)
Chloride: 105 mEq/L (ref 96–112)
Creatinine, Ser: 0.79 mg/dL (ref 0.40–1.50)
GFR: 98.05 mL/min (ref >60.00)
Glucose, Bld: 131 mg/dL — ABNORMAL HIGH (ref 70–99)
Potassium: 4.1 mEq/L (ref 3.5–5.1)
Sodium: 140 mEq/L (ref 135–145)

## 2020-12-09 LAB — CBC WITH DIFFERENTIAL/PLATELET
Basophils Absolute: 0.1 10*3/uL (ref 0.0–0.1)
Basophils Relative: 0.8 % (ref 0.0–3.0)
Eosinophils Absolute: 0.2 10*3/uL (ref 0.0–0.7)
Eosinophils Relative: 3 % (ref 0.0–5.0)
HCT: 40.7 % (ref 39.0–52.0)
Hemoglobin: 14 g/dL (ref 13.0–17.0)
Lymphocytes Relative: 28.5 % (ref 12.0–46.0)
Lymphs Abs: 1.9 10*3/uL (ref 0.7–4.0)
MCHC: 34.3 g/dL (ref 30.0–36.0)
MCV: 93.8 fl (ref 78.0–100.0)
Monocytes Absolute: 0.6 10*3/uL (ref 0.1–1.0)
Monocytes Relative: 8.5 % (ref 3.0–12.0)
Neutro Abs: 4 10*3/uL (ref 1.4–7.7)
Neutrophils Relative %: 59.2 % (ref 43.0–77.0)
Platelets: 185 10*3/uL (ref 150.0–400.0)
RBC: 4.34 Mil/uL (ref 4.22–5.81)
RDW: 13 % (ref 11.5–15.5)
WBC: 6.8 10*3/uL (ref 4.0–10.5)

## 2020-12-09 LAB — LIPID PANEL
Cholesterol: 234 mg/dL — ABNORMAL HIGH (ref 0–200)
HDL: 52.5 mg/dL (ref >39.00)
LDL Cholesterol: 151 mg/dL — ABNORMAL HIGH (ref 0–99)
NonHDL: 181.83
Total CHOL/HDL Ratio: 4
Triglycerides: 154 mg/dL — ABNORMAL HIGH (ref 0.0–149.0)
VLDL: 30.8 mg/dL (ref 0.0–40.0)

## 2020-12-09 LAB — HEPATIC FUNCTION PANEL
ALT: 35 U/L (ref 0–53)
AST: 21 U/L (ref 0–37)
Albumin: 4.5 g/dL (ref 3.5–5.2)
Alkaline Phosphatase: 61 U/L (ref 39–117)
Bilirubin, Direct: 0.1 mg/dL (ref 0.0–0.3)
Total Bilirubin: 0.5 mg/dL (ref 0.2–1.2)
Total Protein: 7.8 g/dL (ref 6.0–8.3)

## 2020-12-09 LAB — TSH: TSH: 1.6 u[IU]/mL (ref 0.35–5.50)

## 2020-12-09 LAB — HEMOGLOBIN A1C: Hgb A1c MFr Bld: 6.4 % (ref 4.6–6.5)

## 2020-12-09 NOTE — Addendum Note (Signed)
Addended by: Wyvonne Lenz on: 12/09/2020 09:17 AM   Modules accepted: Orders

## 2020-12-09 NOTE — Progress Notes (Signed)
   Subjective:    Patient ID: Jose Faulkner, male    DOB: April 09, 1960, 60 y.o.   MRN: [id number]  HPI Here for a well exam. He feels well. He sees Dr. Louis Meckel yearly for prostate exams and PSA checks.    Review of Systems  Constitutional: Negative.   HENT: Negative.    Eyes: Negative.   Respiratory: Negative.    Cardiovascular: Negative.   Gastrointestinal: Negative.   Genitourinary: Negative.   Musculoskeletal: Negative.   Skin: Negative.   Neurological: Negative.   Psychiatric/Behavioral: Negative.        Objective:   Physical Exam Constitutional:      General: He is not in acute distress.    Appearance: Normal appearance. He is well-developed. He is not diaphoretic.  HENT:     Head: Normocephalic and atraumatic.     Right Ear: External ear normal.     Left Ear: External ear normal.     Nose: Nose normal.     Mouth/Throat:     Pharynx: No oropharyngeal exudate.  Eyes:     General: No scleral icterus.       Right eye: No discharge.        Left eye: No discharge.     Conjunctiva/sclera: Conjunctivae normal.     Pupils: Pupils are equal, round, and reactive to light.  Neck:     Thyroid: No thyromegaly.     Vascular: No JVD.     Trachea: No tracheal deviation.  Cardiovascular:     Rate and Rhythm: Normal rate and regular rhythm.     Heart sounds: Normal heart sounds. No murmur heard.   No friction rub. No gallop.  Pulmonary:     Effort: Pulmonary effort is normal. No respiratory distress.     Breath sounds: Normal breath sounds. No wheezing or rales.  Chest:     Chest wall: No tenderness.  Abdominal:     General: Bowel sounds are normal. There is no distension.     Palpations: Abdomen is soft. There is no mass.     Tenderness: There is no abdominal tenderness. There is no guarding or rebound.  Genitourinary:    Penis: No tenderness.   Musculoskeletal:        General: No tenderness. Normal range of motion.     Cervical back: Neck supple.  Lymphadenopathy:      Cervical: No cervical adenopathy.  Skin:    General: Skin is warm and dry.     Coloration: Skin is not pale.     Findings: No erythema or rash.  Neurological:     Mental Status: He is alert and oriented to person, place, and time.     Cranial Nerves: No cranial nerve deficit.     Motor: No abnormal muscle tone.     Coordination: Coordination normal.     Deep Tendon Reflexes: Reflexes are normal and symmetric. Reflexes normal.  Psychiatric:        Behavior: Behavior normal.        Thought Content: Thought content normal.        Judgment: Judgment normal.          Assessment & Plan:  Well exam. We discussed diet and exercise. Get fasting labs. Alysia Penna, MD

## 2020-12-12 ENCOUNTER — Other Ambulatory Visit: Payer: Self-pay

## 2020-12-12 MED ORDER — ATORVASTATIN CALCIUM 10 MG PO TABS: 10.0000 mg | ORAL_TABLET | Freq: Every day | ORAL | 3 refills | Status: DC

## 2022-10-15 ENCOUNTER — Ambulatory Visit (INDEPENDENT_AMBULATORY_CARE_PROVIDER_SITE_OTHER): Payer: 59 | Admitting: Family Medicine

## 2022-10-15 VITALS — BP 170/100 | HR 63 | Temp 97.7°F | Ht 68.0 in | Wt 201.0 lb

## 2022-10-15 DIAGNOSIS — Z0000 Encounter for general adult medical examination without abnormal findings: Secondary | ICD-10-CM

## 2022-10-15 MED ORDER — DIAZEPAM 5 MG PO TABS: 5.0000 mg | ORAL_TABLET | Freq: Two times a day (BID) | ORAL | 0 refills | Status: DC | PRN

## 2022-10-15 NOTE — Progress Notes (Signed)
Subjective:    Patient ID: Jose Faulkner, male    DOB: 1960-05-27, 62 y.o.   MRN: [id number]  HPI Here for a well exam. He feels fine. Two years ago we intended to start him on Atorvastatin for high cholesterol (his LDL was 152) but his partner convinced him to not take it because she heard it has side effects. He tries to eat a healthy diet and he walks for exercise. He has never had a high BP reading to his knowledge. He sees Dr. Marlou Porch for urologic exams, and his PSA was 1.63 in January. Finally he gets very anxious on plane flights, and he asks for something he can take to relax on a plane.    Review of Systems  Constitutional: Negative.   HENT: Negative.    Eyes: Negative.   Respiratory: Negative.    Cardiovascular: Negative.   Gastrointestinal: Negative.   Genitourinary: Negative.   Musculoskeletal: Negative.   Skin: Negative.   Neurological: Negative.   Psychiatric/Behavioral: Negative.         Objective:   Physical Exam Constitutional:      General: He is not in acute distress.    Appearance: Normal appearance. He is well-developed. He is not diaphoretic.  HENT:     Head: Normocephalic and atraumatic.     Right Ear: External ear normal.     Left Ear: External ear normal.     Nose: Nose normal.     Mouth/Throat:     Pharynx: No oropharyngeal exudate.  Eyes:     General: No scleral icterus.       Right eye: No discharge.        Left eye: No discharge.     Conjunctiva/sclera: Conjunctivae normal.     Pupils: Pupils are equal, round, and reactive to light.  Neck:     Thyroid: No thyromegaly.     Vascular: No JVD.     Trachea: No tracheal deviation.  Cardiovascular:     Rate and Rhythm: Normal rate and regular rhythm.     Pulses: Normal pulses.     Heart sounds: Normal heart sounds. No murmur heard.    No friction rub. No gallop.  Pulmonary:     Effort: Pulmonary effort is normal. No respiratory distress.     Breath sounds: Normal breath sounds. No wheezing or  rales.  Chest:     Chest wall: No tenderness.  Abdominal:     General: Bowel sounds are normal. There is no distension.     Palpations: Abdomen is soft. There is no mass.     Tenderness: There is no abdominal tenderness. There is no guarding or rebound.  Genitourinary:    Penis: Normal. No tenderness.      Testes: Normal.     Prostate: Normal.     Rectum: Normal. Guaiac result negative.  Musculoskeletal:        General: No tenderness. Normal range of motion.     Cervical back: Neck supple.  Lymphadenopathy:     Cervical: No cervical adenopathy.  Skin:    General: Skin is warm and dry.     Coloration: Skin is not pale.     Findings: No erythema or rash.  Neurological:     General: No focal deficit present.     Mental Status: He is alert and oriented to person, place, and time.     Cranial Nerves: No cranial nerve deficit.     Motor: No abnormal muscle tone.  Coordination: Coordination normal.     Deep Tendon Reflexes: Reflexes are normal and symmetric. Reflexes normal.  Psychiatric:        Mood and Affect: Mood normal.        Behavior: Behavior normal.        Thought Content: Thought content normal.        Judgment: Judgment normal.           Assessment & Plan:  Well exam. We discussed diet and exercise. Get fasting labs. His BP is quite elevated today. I asked him to check his BP at home 3 times a day for the next week, and he will then report his results back to [place]. He can try Diazepam 5 mg as needed for travel anxiety. Gershon Crane, MD

## 2022-10-16 ENCOUNTER — Other Ambulatory Visit: Payer: 59

## 2022-10-16 DIAGNOSIS — Z0000 Encounter for general adult medical examination without abnormal findings: Secondary | ICD-10-CM

## 2022-10-16 LAB — CBC WITH DIFFERENTIAL/PLATELET
Basophils Absolute: 0 10*3/uL (ref 0.0–0.1)
Basophils Relative: 0.8 % (ref 0.0–3.0)
Eosinophils Absolute: 0.1 10*3/uL (ref 0.0–0.7)
Eosinophils Relative: 2.4 % (ref 0.0–5.0)
HCT: 43.4 % (ref 39.0–52.0)
Hemoglobin: 14.5 g/dL (ref 13.0–17.0)
Lymphocytes Relative: 33.5 % (ref 12.0–46.0)
Lymphs Abs: 2.1 10*3/uL (ref 0.7–4.0)
MCHC: 33.5 g/dL (ref 30.0–36.0)
MCV: 94.7 fL (ref 78.0–100.0)
Monocytes Absolute: 0.5 10*3/uL (ref 0.1–1.0)
Monocytes Relative: 7.6 % (ref 3.0–12.0)
Neutro Abs: 3.5 10*3/uL (ref 1.4–7.7)
Neutrophils Relative %: 55.7 % (ref 43.0–77.0)
Platelets: 195 10*3/uL (ref 150.0–400.0)
RBC: 4.58 Mil/uL (ref 4.22–5.81)
RDW: 12.6 % (ref 11.5–15.5)
WBC: 6.3 10*3/uL (ref 4.0–10.5)

## 2022-10-16 LAB — BASIC METABOLIC PANEL
BUN: 14 mg/dL (ref 6–23)
CO2: 27 meq/L (ref 19–32)
Calcium: 9.2 mg/dL (ref 8.4–10.5)
Chloride: 103 meq/L (ref 96–112)
Creatinine, Ser: 0.75 mg/dL (ref 0.40–1.50)
GFR: 98.31 mL/min (ref >60.00)
Glucose, Bld: 214 mg/dL — ABNORMAL HIGH (ref 70–99)
Potassium: 4.2 meq/L (ref 3.5–5.1)
Sodium: 138 meq/L (ref 135–145)

## 2022-10-16 LAB — HEPATIC FUNCTION PANEL
ALT: 53 U/L (ref 0–53)
AST: 26 U/L (ref 0–37)
Albumin: 4.1 g/dL (ref 3.5–5.2)
Alkaline Phosphatase: 72 U/L (ref 39–117)
Bilirubin, Direct: 0.1 mg/dL (ref 0.0–0.3)
Total Bilirubin: 0.4 mg/dL (ref 0.2–1.2)
Total Protein: 7 g/dL (ref 6.0–8.3)

## 2022-10-16 LAB — LIPID PANEL
Cholesterol: 278 mg/dL — ABNORMAL HIGH (ref 0–200)
HDL: 45.7 mg/dL (ref >39.00)
LDL Cholesterol: 196 mg/dL — ABNORMAL HIGH (ref 0–99)
NonHDL: 232.62
Total CHOL/HDL Ratio: 6
Triglycerides: 183 mg/dL — ABNORMAL HIGH (ref 0.0–149.0)
VLDL: 36.6 mg/dL (ref 0.0–40.0)

## 2022-10-16 LAB — TSH: TSH: 1.41 u[IU]/mL (ref 0.35–5.50)

## 2022-10-16 LAB — HEMOGLOBIN A1C: Hgb A1c MFr Bld: 8.2 % — ABNORMAL HIGH (ref 4.6–6.5)

## 2022-10-21 ENCOUNTER — Encounter: Payer: Self-pay | Admitting: Family Medicine

## 2022-10-21 MED ORDER — METFORMIN HCL 500 MG PO TABS: 500.0000 mg | ORAL_TABLET | Freq: Two times a day (BID) | ORAL | 5 refills | Status: DC

## 2022-10-21 MED ORDER — ROSUVASTATIN CALCIUM 10 MG PO TABS: 10.0000 mg | ORAL_TABLET | Freq: Every day | ORAL | 5 refills | Status: DC

## 2022-10-22 ENCOUNTER — Encounter: Payer: Self-pay | Admitting: Family Medicine

## 2022-10-22 NOTE — Telephone Encounter (Signed)
Pt was notified of lab results and verbalized understanding

## 2022-10-26 NOTE — Telephone Encounter (Signed)
Spoke with Jose Faulkner this afternoon, requests for Dr Clent Ridges to view BP Reading and advise, Jose Faulkner states that he is going out of the country next week and needs to get his BP to normal before he leaves I've attached the results that I have taken over the last several days.  I don't think they are  going to change much so I'd rather get these to you sooner rather than later.                                                                READING                         DATE         AM                  AFTERNOON                PM           10/16/22      185/98               189/116              158/94           10/17/22      176/97               175/95                196/103           10/18/22      171/101                                      170/89           10/19/22      155/92               186/110              168/91           10/20/22      153/89                                                173/94           10/21/22      162/92

## 2022-10-27 NOTE — Telephone Encounter (Signed)
Call in Losartan HCT 50-12.5 to take once daily, #30 with 3 rf. Have him report back with results in 3 weeks

## 2022-10-28 ENCOUNTER — Encounter: Payer: Self-pay | Admitting: Family Medicine

## 2022-10-29 MED ORDER — LOSARTAN POTASSIUM-HCTZ 50-12.5 MG PO TABS: 1.0000 | ORAL_TABLET | Freq: Every day | ORAL | 3 refills | Status: DC

## 2022-10-29 NOTE — Telephone Encounter (Signed)
Spoke with pt already picked up Rx from pharmacy

## 2023-01-11 ENCOUNTER — Encounter: Payer: Self-pay | Admitting: Family Medicine

## 2023-01-13 NOTE — Telephone Encounter (Signed)
 Noted. We will discuss these results at his OV on 01-18-23.

## 2023-01-18 ENCOUNTER — Ambulatory Visit: Payer: 59 | Admitting: Family Medicine

## 2023-01-18 ENCOUNTER — Encounter: Payer: Self-pay | Admitting: Family Medicine

## 2023-01-18 VITALS — BP 138/80 | HR 61 | Temp 98.7°F | Wt 198.0 lb

## 2023-01-18 DIAGNOSIS — E119 Type 2 diabetes mellitus without complications: Secondary | ICD-10-CM | POA: Diagnosis not present

## 2023-01-18 DIAGNOSIS — I10 Essential (primary) hypertension: Secondary | ICD-10-CM | POA: Insufficient documentation

## 2023-01-18 DIAGNOSIS — F419 Anxiety disorder, unspecified: Secondary | ICD-10-CM | POA: Insufficient documentation

## 2023-01-18 MED ORDER — LOSARTAN POTASSIUM-HCTZ 100-12.5 MG PO TABS: 1.0000 | ORAL_TABLET | Freq: Every day | ORAL | 5 refills | Status: DC

## 2023-01-18 MED ORDER — ALPRAZOLAM 1 MG PO TABS: 1.0000 mg | ORAL_TABLET | Freq: Two times a day (BID) | ORAL | 2 refills | Status: AC | PRN

## 2023-01-18 NOTE — Progress Notes (Signed)
   Subjective:    Patient ID: Jose Faulkner, male    DOB: [birth year], 63 y.o.   MRN: [id number]  HPI Here to follow up on HTN and anxiety. He has been taking Losartan  50-12.5 daily and his BP at home has averaged 150's or 160's systolic. He feels fine. Also he has tried Valium  5 gm 1 and even 2 tabs for anxiety, but he says these have not helped.    Review of Systems  Constitutional: Negative.   Respiratory: Negative.    Cardiovascular: Negative.   Neurological: Negative.   Psychiatric/Behavioral:  Negative for dysphoric mood. The patient is nervous/anxious.        Objective:   Physical Exam Constitutional:      Appearance: Normal appearance.  Cardiovascular:     Rate and Rhythm: Normal rate and regular rhythm.     Pulses: Normal pulses.     Heart sounds: Normal heart sounds.  Pulmonary:     Effort: Pulmonary effort is normal.     Breath sounds: Normal breath sounds.  Neurological:     Mental Status: He is alert.  Psychiatric:        Mood and Affect: Mood normal.        Behavior: Behavior normal.        Thought Content: Thought content normal.           Assessment & Plan:  For the HTN, we will change to Losartan  HCT 100-12.5 daily. For the anxiety, we will stop Valium  and try Xanax  1 mg as needed. Follow up in 3-4 weeks . Garnette Olmsted, MD

## 2023-01-20 ENCOUNTER — Other Ambulatory Visit (INDEPENDENT_AMBULATORY_CARE_PROVIDER_SITE_OTHER): Payer: 59

## 2023-01-20 DIAGNOSIS — E119 Type 2 diabetes mellitus without complications: Secondary | ICD-10-CM

## 2023-01-20 LAB — LIPID PANEL
Cholesterol: 132 mg/dL (ref 0–200)
HDL: 48 mg/dL (ref >39.00)
LDL Cholesterol: 60 mg/dL (ref 0–99)
NonHDL: 83.55
Total CHOL/HDL Ratio: 3
Triglycerides: 118 mg/dL (ref 0.0–149.0)
VLDL: 23.6 mg/dL (ref 0.0–40.0)

## 2023-01-20 LAB — HEMOGLOBIN A1C: Hgb A1c MFr Bld: 7.1 % — ABNORMAL HIGH (ref 4.6–6.5)

## 2023-01-26 ENCOUNTER — Other Ambulatory Visit: Payer: Self-pay

## 2023-01-26 MED ORDER — METFORMIN HCL 1000 MG PO TABS: 1000.0000 mg | ORAL_TABLET | Freq: Two times a day (BID) | ORAL | 3 refills | Status: DC

## 2023-03-18 ENCOUNTER — Encounter: Payer: Self-pay | Admitting: Family Medicine

## 2023-03-24 NOTE — Telephone Encounter (Signed)
 These readings look very good. Stay on the current regimen

## 2023-04-23 ENCOUNTER — Other Ambulatory Visit: Payer: Self-pay | Admitting: Family Medicine

## 2023-04-28 ENCOUNTER — Telehealth: Payer: Self-pay | Admitting: Family Medicine

## 2023-04-28 ENCOUNTER — Other Ambulatory Visit (INDEPENDENT_AMBULATORY_CARE_PROVIDER_SITE_OTHER): Payer: 59

## 2023-04-28 DIAGNOSIS — E119 Type 2 diabetes mellitus without complications: Secondary | ICD-10-CM | POA: Diagnosis not present

## 2023-04-28 LAB — HEMOGLOBIN A1C: Hgb A1c MFr Bld: 6.9 % — ABNORMAL HIGH (ref 4.6–6.5)

## 2023-04-28 NOTE — Telephone Encounter (Signed)
 Done

## 2023-05-06 ENCOUNTER — Encounter: Payer: Self-pay | Admitting: *Deleted

## 2023-06-14 ENCOUNTER — Ambulatory Visit: Admitting: Family Medicine

## 2023-06-14 ENCOUNTER — Encounter: Payer: Self-pay | Admitting: Family Medicine

## 2023-06-14 VITALS — BP 128/80 | HR 68 | Temp 97.8°F | Wt 196.0 lb

## 2023-06-14 DIAGNOSIS — M26652 Arthropathy of left temporomandibular joint: Secondary | ICD-10-CM | POA: Diagnosis not present

## 2023-06-14 NOTE — Progress Notes (Signed)
   Subjective:    Patient ID: Jose Faulkner, male    DOB: [birth year], 63 y.o.   MRN: [id number]  HPI Here for pain in the left jaw for the past 2 weeks. He has never had this before. It hurts for him to chew.    Review of Systems  Constitutional: Negative.   HENT:  Negative for ear pain, postnasal drip, sinus pain and sore throat.   Eyes: Negative.   Respiratory: Negative.         Objective:   Physical Exam Constitutional:      Appearance: Normal appearance.  HENT:     Right Ear: Tympanic membrane, ear canal and external ear normal.     Left Ear: Tympanic membrane, ear canal and external ear normal.     Nose: Nose normal.     Mouth/Throat:     Pharynx: Oropharynx is clear.     Comments: He is quite tender over the left TMJ. There is some crepitus  Eyes:     Conjunctiva/sclera: Conjunctivae normal.  Pulmonary:     Effort: Pulmonary effort is normal.     Breath sounds: Normal breath sounds.  Lymphadenopathy:     Cervical: No cervical adenopathy.  Neurological:     Mental Status: He is alert.           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 Apply ice packs. Try Ibuprofen 800 mg as needed. Recheck if not better in 2 weeks.  Corita Diego, MD

## 2023-07-23 ENCOUNTER — Other Ambulatory Visit: Payer: Self-pay | Admitting: Family Medicine

## 2023-09-21 ENCOUNTER — Encounter: Payer: Self-pay | Admitting: Gastroenterology

## 2023-10-19 ENCOUNTER — Other Ambulatory Visit: Payer: Self-pay | Admitting: Family Medicine

## 2023-10-22 ENCOUNTER — Encounter: Payer: Self-pay | Admitting: Gastroenterology

## 2023-10-26 ENCOUNTER — Ambulatory Visit (INDEPENDENT_AMBULATORY_CARE_PROVIDER_SITE_OTHER): Admitting: Family Medicine

## 2023-10-26 VITALS — BP 132/80 | HR 79 | Temp 97.6°F | Ht 68.0 in | Wt 195.0 lb

## 2023-10-26 DIAGNOSIS — Z0000 Encounter for general adult medical examination without abnormal findings: Secondary | ICD-10-CM | POA: Diagnosis not present

## 2023-10-26 DIAGNOSIS — M25511 Pain in right shoulder: Secondary | ICD-10-CM | POA: Diagnosis not present

## 2023-10-26 DIAGNOSIS — G8929 Other chronic pain: Secondary | ICD-10-CM | POA: Diagnosis not present

## 2023-10-26 MED ORDER — METFORMIN HCL 1000 MG PO TABS: 1000.0000 mg | ORAL_TABLET | Freq: Two times a day (BID) | ORAL | 3 refills | Status: AC

## 2023-10-26 NOTE — Progress Notes (Signed)
 Subjective:    Patient ID: Jose Faulkner, male    DOB: [birth year], 63 y.o.   MRN: [id number]  HPI Here for a well exam. He feels fine except for a chronic right shoulder pain that started 2 months ago. No hx of trauma. He cannot raise the arm above his head due to pain. He sees Dr. Cam regularly for prostate exams and PSA tests. He has a diabetic eye exam yearly.    Review of Systems  Constitutional: Negative.   HENT: Negative.    Eyes: Negative.   Respiratory: Negative.    Cardiovascular: Negative.   Gastrointestinal: Negative.   Genitourinary: Negative.   Musculoskeletal:  Positive for arthralgias.  Skin: Negative.   Neurological: Negative.   Psychiatric/Behavioral: Negative.         Objective:   Physical Exam Constitutional:      General: He is not in acute distress.    Appearance: Normal appearance. He is well-developed. He is not diaphoretic.  HENT:     Head: Normocephalic and atraumatic.     Right Ear: External ear normal.     Left Ear: External ear normal.     Nose: Nose normal.     Mouth/Throat:     Pharynx: No oropharyngeal exudate.  Eyes:     General: No scleral icterus.       Right eye: No discharge.        Left eye: No discharge.     Conjunctiva/sclera: Conjunctivae normal.     Pupils: Pupils are equal, round, and reactive to light.  Neck:     Thyroid : No thyromegaly.     Vascular: No JVD.     Trachea: No tracheal deviation.  Cardiovascular:     Rate and Rhythm: Normal rate and regular rhythm.     Pulses: Normal pulses.     Heart sounds: Normal heart sounds. No murmur heard.    No friction rub. No gallop.  Pulmonary:     Effort: Pulmonary effort is normal. No respiratory distress.     Breath sounds: Normal breath sounds. No wheezing or rales.  Chest:     Chest wall: No tenderness.  Abdominal:     General: Bowel sounds are normal. There is no distension.     Palpations: Abdomen is soft. There is no mass.     Tenderness: There is no abdominal  tenderness. There is no guarding or rebound.  Genitourinary:    Penis: No tenderness.   Musculoskeletal:        General: No tenderness. Normal range of motion.     Cervical back: Neck supple.     Comments: Right shoulder is not tender. ROM is limited by pain as far as extension and internal/external rotation   Lymphadenopathy:     Cervical: No cervical adenopathy.  Skin:    General: Skin is warm and dry.     Coloration: Skin is not pale.     Findings: No erythema or rash.  Neurological:     General: No focal deficit present.     Mental Status: He is alert and oriented to person, place, and time.     Cranial Nerves: No cranial nerve deficit.     Motor: No abnormal muscle tone.     Coordination: Coordination normal.     Deep Tendon Reflexes: Reflexes are normal and symmetric. Reflexes normal.  Psychiatric:        Mood and Affect: Mood normal.        Behavior: Behavior normal.  Thought Content: Thought content normal.        Judgment: Judgment normal.           Assessment & Plan:  Well exam. We discussed diet and exercise. Get fasting labs. Refer to Orthopedics for the right shoulder pain. He is scheduled for a colonoscopy with Dr. Legrand on 12-15-23.  Garnette Olmsted, MD

## 2023-10-27 ENCOUNTER — Ambulatory Visit: Payer: Self-pay | Admitting: Family Medicine

## 2023-10-27 LAB — LIPID PANEL
Cholesterol: 121 mg/dL (ref 0–200)
HDL: 38.7 mg/dL — ABNORMAL LOW (ref >39.00)
LDL Cholesterol: 51 mg/dL (ref 0–99)
NonHDL: 82.1
Total CHOL/HDL Ratio: 3
Triglycerides: 155 mg/dL — ABNORMAL HIGH (ref 0.0–149.0)
VLDL: 31 mg/dL (ref 0.0–40.0)

## 2023-10-27 LAB — BASIC METABOLIC PANEL WITH GFR
BUN: 20 mg/dL (ref 6–23)
CO2: 26 meq/L (ref 19–32)
Calcium: 9.3 mg/dL (ref 8.4–10.5)
Chloride: 104 meq/L (ref 96–112)
Creatinine, Ser: 0.82 mg/dL (ref 0.40–1.50)
GFR: 95.01 mL/min (ref >60.00)
Glucose, Bld: 115 mg/dL — ABNORMAL HIGH (ref 70–99)
Potassium: 3.7 meq/L (ref 3.5–5.1)
Sodium: 141 meq/L (ref 135–145)

## 2023-10-27 LAB — CBC WITH DIFFERENTIAL/PLATELET
Basophils Absolute: 0.1 K/uL (ref 0.0–0.1)
Basophils Relative: 1 % (ref 0.0–3.0)
Eosinophils Absolute: 0.3 K/uL (ref 0.0–0.7)
Eosinophils Relative: 3.4 % (ref 0.0–5.0)
HCT: 39.2 % (ref 39.0–52.0)
Hemoglobin: 13.1 g/dL (ref 13.0–17.0)
Lymphocytes Relative: 27.9 % (ref 12.0–46.0)
Lymphs Abs: 2.2 K/uL (ref 0.7–4.0)
MCHC: 33.4 g/dL (ref 30.0–36.0)
MCV: 92.9 fl (ref 78.0–100.0)
Monocytes Absolute: 0.6 K/uL (ref 0.1–1.0)
Monocytes Relative: 7.5 % (ref 3.0–12.0)
Neutro Abs: 4.8 K/uL (ref 1.4–7.7)
Neutrophils Relative %: 60.2 % (ref 43.0–77.0)
Platelets: 220 K/uL (ref 150.0–400.0)
RBC: 4.22 Mil/uL (ref 4.22–5.81)
RDW: 12.7 % (ref 11.5–15.5)
WBC: 7.9 K/uL (ref 4.0–10.5)

## 2023-10-27 LAB — TSH: TSH: 1.93 u[IU]/mL (ref 0.35–5.50)

## 2023-10-27 LAB — HEPATIC FUNCTION PANEL
ALT: 27 U/L (ref 0–53)
AST: 20 U/L (ref 0–37)
Albumin: 4.6 g/dL (ref 3.5–5.2)
Alkaline Phosphatase: 53 U/L (ref 39–117)
Bilirubin, Direct: 0.1 mg/dL (ref 0.0–0.3)
Total Bilirubin: 0.3 mg/dL (ref 0.2–1.2)
Total Protein: 7.7 g/dL (ref 6.0–8.3)

## 2023-10-27 LAB — HEMOGLOBIN A1C: Hgb A1c MFr Bld: 6.8 % — ABNORMAL HIGH (ref 4.6–6.5)

## 2023-12-08 ENCOUNTER — Ambulatory Visit

## 2023-12-08 ENCOUNTER — Encounter: Payer: Self-pay | Admitting: Gastroenterology

## 2023-12-08 VITALS — Ht 68.0 in | Wt 190.0 lb

## 2023-12-08 DIAGNOSIS — Z860100 Personal history of colon polyps, unspecified: Secondary | ICD-10-CM

## 2023-12-08 MED ORDER — NA SULFATE-K SULFATE-MG SULF 17.5-3.13-1.6 GM/177ML PO SOLN: 1.0000 | Freq: Once | ORAL | 0 refills | Status: AC

## 2023-12-08 NOTE — Progress Notes (Signed)

## 2023-12-13 ENCOUNTER — Encounter: Payer: Self-pay | Admitting: Gastroenterology

## 2023-12-15 ENCOUNTER — Encounter: Payer: Self-pay | Admitting: Gastroenterology

## 2023-12-15 ENCOUNTER — Ambulatory Visit: Admitting: Gastroenterology

## 2023-12-15 VITALS — BP 127/77 | HR 81 | Temp 97.2°F | Resp 19 | Ht 68.0 in | Wt 190.0 lb

## 2023-12-15 DIAGNOSIS — Z860100 Personal history of colon polyps, unspecified: Secondary | ICD-10-CM | POA: Diagnosis not present

## 2023-12-15 DIAGNOSIS — D123 Benign neoplasm of transverse colon: Secondary | ICD-10-CM

## 2023-12-15 MED ORDER — SODIUM CHLORIDE 0.9 % IV SOLN: 500.0000 mL | Freq: Once | INTRAVENOUS | Status: DC

## 2023-12-15 NOTE — Progress Notes (Signed)
 Pt's states no medical or surgical changes since previsit or office visit.

## 2023-12-15 NOTE — Progress Notes (Signed)
 Called to room to assist during endoscopic procedure.  Patient ID and intended procedure confirmed with present staff. Received instructions for my participation in the procedure from the performing physician.

## 2023-12-15 NOTE — Op Note (Signed)
 [facility] Patient Name: Jose Faulkner Procedure Date: 12/15/2023 10:23 AM MRN: [id number] Endoscopist: Victory L. Legrand , MD, [id number] Age: 63 Referring MD:  Date of Birth: November 16, 1960 Gender: Male Account #: [id number] Procedure:                Colonoscopy Indications:              Personal history of sessile serrated colon polyp                            (less than 10 mm in size) with no dysplasia - Oct                            2020 Medicines:                Monitored Anesthesia Care Procedure:                Pre-Anesthesia Assessment:                           - Prior to the procedure, a History and Physical                            was performed, and patient medications and                            allergies were reviewed. The patient's tolerance of                            previous anesthesia was also reviewed. The risks                            and benefits of the procedure and the sedation                            options and risks were discussed with the patient.                            All questions were answered, and informed consent                            was obtained. Prior Anticoagulants: The patient has                            taken no anticoagulant or antiplatelet agents. ASA                            Grade Assessment: II - A patient with mild systemic                            disease. After reviewing the risks and benefits,                            the patient was deemed in satisfactory condition to  undergo the procedure.                           After obtaining informed consent, the colonoscope                            was passed under direct vision. Throughout the                            procedure, the patient's blood pressure, pulse, and                            oxygen saturations were monitored continuously. The                            Olympus Scope SN [id number] was introduced through the                             anus and advanced to the the cecum, identified by                            appendiceal orifice and ileocecal valve. The                            colonoscopy was performed without difficulty. The                            patient tolerated the procedure well. The quality                            of the bowel preparation was excellent. The                            ileocecal valve, appendiceal orifice, and rectum                            were photographed. Scope In: 10:40:10 AM Scope Out: 10:56:23 AM Scope Withdrawal Time: 0 hours 12 minutes 31 seconds  Total Procedure Duration: 0 hours 16 minutes 13 seconds  Findings:                 The perianal and digital rectal examinations were                            normal.                           Repeat examination of right colon under NBI                            performed.                           A diminutive polyp was found in the transverse  colon. The polyp was flat. The polyp was removed                            with a cold snare. Resection and retrieval were                            complete.                           Internal hemorrhoids were found.                           The exam was otherwise without abnormality on                            direct and retroflexion views. Complications:            No immediate complications. Estimated Blood Loss:     Estimated blood loss was minimal. Impression:               - One diminutive polyp in the transverse colon,                            removed with a cold snare. Resected and retrieved.                           - Internal hemorrhoids.                           - The examination was otherwise normal on direct                            and retroflexion views. Recommendation:           - Patient has a contact number available for                            emergencies. The signs and symptoms of potential                             delayed complications were discussed with the                            patient. Return to normal activities tomorrow.                            Written discharge instructions were provided to the                            patient.                           - Resume previous diet.                           - Continue present medications.                           -  Await pathology results.                           - Repeat colonoscopy is recommended for                            surveillance. The colonoscopy date will be                            determined after pathology results from today's                            exam become available for review. Jariyah Hackley L. Legrand, MD 12/15/2023 11:00:24 AM This report has been signed electronically.

## 2023-12-15 NOTE — Progress Notes (Signed)
 History and Physical:  This patient presents for endoscopic testing for: Encounter Diagnosis  Name Primary?   Hx of colonic polyps Yes    63 year old man here today for a surveillance colonoscopy due to a history of colon polyps.  He had a subcentimeter SSP without dysplasia on last colonoscopy in October 2020.  Patient denies chronic abdominal pain, rectal bleeding, constipation or diarrhea.   Patient is otherwise without complaints or active issues today.   Past Medical History: Past Medical History:  Diagnosis Date   Allergy    Diabetes mellitus without complication (HCC)    Elevated PSA    sees Dr. Cam   Hypertension    Post-operative nausea and vomiting    Skin cancer    top of head/melanoma   Sleep apnea    has a c-pap machine-but not using it.   Sleep apnea, obstructive    sees Dr. Corrie, uses CPAP     Past Surgical History: Past Surgical History:  Procedure Laterality Date   BASAL CELL CARCINOMA EXCISION  Dec. 2012   per Dr. Dwane, scalp vertex    COLONOSCOPY  10/17/2018   per Dr. Legrand, sessile serrated polyp, repeat in 5 yrs    KNEE ARTHROSCOPY     left knee   PROSTATE BIOPSY  2017   benign    Allergies: No Known Allergies  Outpatient Meds: Current Outpatient Medications  Medication Sig Dispense Refill   cetirizine (ZYRTEC) 10 MG tablet Take 10 mg by mouth daily.     finasteride (PROSCAR) 5 MG tablet Take 5 mg by mouth daily.     losartan -hydrochlorothiazide (HYZAAR) 100-12.5 MG tablet Take 1 tablet by mouth daily. 30 tablet 5   metFORMIN  (GLUCOPHAGE ) 1000 MG tablet Take 1 tablet (1,000 mg total) by mouth 2 (two) times daily with a meal. 180 tablet 3   rosuvastatin  (CRESTOR ) 10 MG tablet TAKE ONE TABLET BY MOUTH ONCE DAILY. 30 tablet 5   ALPRAZolam  (XANAX ) 1 MG tablet Take 1 tablet (1 mg total) by mouth 2 (two) times daily as needed for anxiety. 60 tablet 2   meloxicam (MOBIC) 15 MG tablet Take 15 mg by mouth daily.     Current  Facility-Administered Medications  Medication Dose Route Frequency Provider Last Rate Last Admin   0.9 %  sodium chloride  infusion  500 mL Intravenous Once Danis, Victory CROME III, MD          ___________________________________________________________________ Objective   Exam:  BP (!) 156/98   Pulse 85   Temp (!) 97.2 F (36.2 C) (Temporal)   Ht 5' 8 (1.727 m)   Wt 190 lb (86.2 kg)   SpO2 96%   BMI 28.89 kg/m   CV: regular , S1/S2 Resp: clear to auscultation bilaterally, normal RR and effort noted GI: soft, no tenderness, with active bowel sounds.   Assessment: Encounter Diagnosis  Name Primary?   Hx of colonic polyps Yes     Plan: Colonoscopy   The benefits and risks of the planned procedure(s) were described in detail with the patient or (when appropriate) their health care proxy.  Risks were outlined as including, but not limited to, bleeding, infection, perforation, adverse medication reaction leading to cardiac or pulmonary decompensation, pancreatitis (if ERCP).  The limitation of incomplete mucosal visualization was also discussed.  No guarantees or warranties were given.  The patient was provided an opportunity to ask questions and all were answered. The patient agreed with the plan.   The patient is appropriate for an  endoscopic procedure in the ambulatory setting.   - Victory Brand, MD

## 2023-12-15 NOTE — Patient Instructions (Signed)

## 2023-12-15 NOTE — Progress Notes (Signed)
 To pacu, VSS. Report to [email or web address]

## 2023-12-16 ENCOUNTER — Telehealth: Payer: Self-pay

## 2023-12-16 NOTE — Telephone Encounter (Signed)
°  Follow up Call-     12/15/2023   10:00 AM  Call back number  Post procedure Call Back phone  # [phone]  Permission to leave phone message Yes     Patient questions:  Do you have a fever, pain , or abdominal swelling? No. Pain Score  0 *  Have you tolerated food without any problems? Yes.    Have you been able to return to your normal activities? Yes.    Do you have any questions about your discharge instructions: Diet   No. Medications  No. Follow up visit  No.  Do you have questions or concerns about your Care? No.  Actions: * If pain score is 4 or above: No action needed, pain <4.

## 2023-12-21 ENCOUNTER — Ambulatory Visit: Payer: Self-pay | Admitting: Gastroenterology

## 2023-12-21 LAB — SURGICAL PATHOLOGY

## 2024-01-22 ENCOUNTER — Other Ambulatory Visit: Payer: Self-pay | Admitting: Family Medicine
